# Patient Record
Sex: Female | Born: 1975 | Race: Black or African American | Hispanic: No | Marital: Married | Smoking: Never smoker
Health system: Southern US, Community
[De-identification: ages and names within clinical notes are randomized; demographics above are authoritative.]

## PROBLEM LIST (undated history)

## (undated) DIAGNOSIS — Z789 Other specified health status: Secondary | ICD-10-CM

## (undated) DIAGNOSIS — I10 Essential (primary) hypertension: Secondary | ICD-10-CM

## (undated) DIAGNOSIS — D649 Anemia, unspecified: Secondary | ICD-10-CM

## (undated) DIAGNOSIS — Z0282 Encounter for adoption services: Secondary | ICD-10-CM

## (undated) DIAGNOSIS — R011 Cardiac murmur, unspecified: Secondary | ICD-10-CM

## (undated) HISTORY — DX: Other specified health status: Z78.9

## (undated) HISTORY — DX: Encounter for adoption services: Z02.82

---

## 1999-06-20 ENCOUNTER — Inpatient Hospital Stay (HOSPITAL_COMMUNITY): Admission: AD | Admit: 1999-06-20 | Discharge: 1999-06-20 | Payer: Self-pay | Admitting: *Deleted

## 1999-12-20 ENCOUNTER — Emergency Department (HOSPITAL_COMMUNITY): Admission: EM | Admit: 1999-12-20 | Discharge: 1999-12-20 | Payer: Self-pay | Admitting: Emergency Medicine

## 2020-06-01 ENCOUNTER — Emergency Department (HOSPITAL_BASED_OUTPATIENT_CLINIC_OR_DEPARTMENT_OTHER)
Admission: EM | Admit: 2020-06-01 | Discharge: 2020-06-01 | Disposition: A | Discharge status: Home / Self Care | Payer: Self-pay | Attending: Emergency Medicine | Admitting: Emergency Medicine

## 2020-06-01 ENCOUNTER — Other Ambulatory Visit: Payer: Self-pay

## 2020-06-01 ENCOUNTER — Encounter (HOSPITAL_BASED_OUTPATIENT_CLINIC_OR_DEPARTMENT_OTHER): Payer: Self-pay | Admitting: Emergency Medicine

## 2020-06-01 DIAGNOSIS — R220 Localized swelling, mass and lump, head: Secondary | ICD-10-CM

## 2020-06-01 DIAGNOSIS — K0889 Other specified disorders of teeth and supporting structures: Secondary | ICD-10-CM | POA: Insufficient documentation

## 2020-06-01 MED ORDER — AMOXICILLIN-POT CLAVULANATE 875-125 MG PO TABS: 1.0000 | ORAL_TABLET | Freq: Two times a day (BID) | ORAL | 0 refills | Status: DC

## 2020-06-01 MED ORDER — NAPROXEN 250 MG PO TABS
500.0000 mg | ORAL_TABLET | Freq: Once | ORAL | Status: AC
  Administered 2020-06-01: 500 mg via ORAL
  Filled 2020-06-01: qty 2

## 2020-06-01 MED ORDER — PENICILLIN V POTASSIUM 250 MG PO TABS
500.0000 mg | ORAL_TABLET | ORAL | Status: AC
  Administered 2020-06-01: 500 mg via ORAL
  Filled 2020-06-01: qty 2

## 2020-06-01 NOTE — ED Triage Notes (Signed)
Pc/o dental pain with left sided facial swelling.

## 2020-06-01 NOTE — Discharge Instructions (Signed)
Take 4 over the counter ibuprofen tablets 3 times a day or 2 over-the-counter naproxen tablets twice a day for pain. Also take tylenol 1000mg (2 extra strength) four times a day.   Return for difficulty swallowing, fever.  Follow up with a dentist in the office.  Follow up with your PCP.

## 2020-06-01 NOTE — ED Provider Notes (Signed)
[facility] EMERGENCY DEPARTMENT Provider Note   CSN: [id number] Arrival date & time: 06/01/20  0457     History Chief Complaint  Patient presents with  . Dental Pain    Tanya Murray is a 44 y.o. female.  44 yo F with a chief complaints of left-sided facial swelling.  This been going on for a couple days.  She said it started with some dental pain and now associated with swelling.  She denies fevers denies difficulty swallowing.  Has been having trouble with posterior molar on the left side.  Denies ear pain denies cough or congestion.  The history is provided by the patient.  Dental Pain Location:  Lower Lower teeth location:  18/LL 2nd molar Quality:  Aching Severity:  Mild Onset quality:  Gradual Duration:  3 days Timing:  Constant Progression:  Worsening Chronicity:  New Relieved by:  Nothing Worsened by:  Nothing Ineffective treatments:  None tried Associated symptoms: facial swelling   Associated symptoms: no congestion, no fever and no headaches        History reviewed. No pertinent past medical history.  There are no problems to display for this patient.   History reviewed. No pertinent surgical history.   OB History   No obstetric history on file.     No family history on file.  Social History   Tobacco Use  . Smoking status: Never Smoker  . Smokeless tobacco: Never Used  Substance Use Topics  . Alcohol use: Yes  . Drug use: Not on file    Home Medications Prior to Admission medications   Medication Sig Start Date End Date Taking? Authorizing Provider  amoxicillin-clavulanate (AUGMENTIN) 875-125 MG tablet Take 1 tablet by mouth every 12 (twelve) hours. 06/01/20   Deno Etienne, DO    Allergies    Patient has no known allergies.  Review of Systems   Review of Systems  Constitutional: Negative for chills and fever.  HENT: Positive for dental problem and facial swelling. Negative for congestion and rhinorrhea.   Eyes: Negative  for redness and visual disturbance.  Respiratory: Negative for shortness of breath and wheezing.   Cardiovascular: Negative for chest pain and palpitations.  Gastrointestinal: Negative for nausea and vomiting.  Genitourinary: Negative for dysuria and urgency.  Musculoskeletal: Negative for arthralgias and myalgias.  Skin: Negative for pallor and wound.  Neurological: Negative for dizziness and headaches.    Physical Exam Updated Vital Signs BP 132/80 (BP Location: Right Arm)   Pulse 86   Temp 98.8 F (37.1 C) (Oral)   Resp 18   Ht 5\' 10"  (1.778 m)   Wt 93 kg   SpO2 100%   BMI 29.41 kg/m   Physical Exam Vitals and nursing note reviewed.  Constitutional:      General: She is not in acute distress.    Appearance: She is well-developed. She is not diaphoretic.  HENT:     Head: Normocephalic and atraumatic.      Mouth/Throat:      Comments: No trismus Eyes:     Pupils: Pupils are equal, round, and reactive to light.  Cardiovascular:     Rate and Rhythm: Normal rate and regular rhythm.     Heart sounds: No murmur heard.  No friction rub. No gallop.   Pulmonary:     Effort: Pulmonary effort is normal.     Breath sounds: No wheezing or rales.  Abdominal:     General: There is no distension.  Palpations: Abdomen is soft.     Tenderness: There is no abdominal tenderness.  Musculoskeletal:        General: No tenderness.     Cervical back: Normal range of motion and neck supple.  Skin:    General: Skin is warm and dry.  Neurological:     Mental Status: She is alert and oriented to person, place, and time.  Psychiatric:        Behavior: Behavior normal.     ED Results / Procedures / Treatments   Labs (all labs ordered are listed, but only abnormal results are displayed) Labs Reviewed - No data to display  EKG None  Radiology No results found.  Procedures Procedures (including critical care time)  Medications Ordered in ED Medications  penicillin v  potassium (VEETID) tablet 500 mg (500 mg Oral Given 06/01/20 0722)  naproxen (NAPROSYN) tablet 500 mg (500 mg Oral Given 06/01/20 3646)    ED Course  I have reviewed the triage vital signs and the nursing notes.  Pertinent labs & imaging results that were available during my care of the patient were reviewed by me and considered in my medical decision making (see chart for details).    MDM Rules/Calculators/A&P                          44 yo F with a chief complaint of facial swelling.  She thinks is coming from her teeth.  She has no obvious dental issue currently.  She has what appears to be a chronic erosion to the left second molar.  She has no active tenderness there nor any swelling intraorally.  Her swelling seems to be submandibular.  Possible sialolithiasis.  We will have her treat with oral candies.  We will have her follow-up with her dentist.  Course of antibiotics for possible infection.  7:57 AM:  I have discussed the diagnosis/risks/treatment options with the patient and believe the pt to be eligible for discharge home to follow-up with Dentist. We also discussed returning to the ED immediately if new or worsening sx occur. We discussed the sx which are most concerning (e.g., sudden worsening pain, fever, inability to tolerate by mouth) that necessitate immediate return. Medications administered to the patient during their visit and any new prescriptions provided to the patient are listed below.  Medications given during this visit Medications  penicillin v potassium (VEETID) tablet 500 mg (500 mg Oral Given 06/01/20 0722)  naproxen (NAPROSYN) tablet 500 mg (500 mg Oral Given 06/01/20 8032)     The patient appears reasonably screen and/or stabilized for discharge and I doubt any other medical condition or other [facility] requiring further screening, evaluation, or treatment in the ED at this time prior to discharge.   Final Clinical Impression(s) / ED Diagnoses Final diagnoses:   Facial swelling    Rx / DC Orders ED Discharge Orders         Ordered    amoxicillin-clavulanate (AUGMENTIN) 875-125 MG tablet  Every 12 hours        06/01/20 [place], [place], DO 06/01/20 [phone]

## 2020-12-09 ENCOUNTER — Other Ambulatory Visit: Payer: Self-pay

## 2020-12-09 ENCOUNTER — Encounter (HOSPITAL_COMMUNITY): Payer: Self-pay

## 2020-12-09 ENCOUNTER — Inpatient Hospital Stay (HOSPITAL_COMMUNITY): Payer: BC Managed Care – PPO

## 2020-12-09 ENCOUNTER — Observation Stay (HOSPITAL_COMMUNITY)
Admission: EM | Admit: 2020-12-09 | Discharge: 2020-12-10 | Disposition: A | Discharge status: Home / Self Care | Payer: BC Managed Care – PPO | Attending: Internal Medicine | Admitting: Internal Medicine

## 2020-12-09 ENCOUNTER — Other Ambulatory Visit (HOSPITAL_COMMUNITY): Payer: BC Managed Care – PPO

## 2020-12-09 ENCOUNTER — Emergency Department (HOSPITAL_COMMUNITY): Payer: BC Managed Care – PPO

## 2020-12-09 DIAGNOSIS — D509 Iron deficiency anemia, unspecified: Secondary | ICD-10-CM | POA: Diagnosis not present

## 2020-12-09 DIAGNOSIS — E611 Iron deficiency: Secondary | ICD-10-CM | POA: Diagnosis not present

## 2020-12-09 DIAGNOSIS — Z20822 Contact with and (suspected) exposure to covid-19: Secondary | ICD-10-CM | POA: Insufficient documentation

## 2020-12-09 DIAGNOSIS — R161 Splenomegaly, not elsewhere classified: Secondary | ICD-10-CM | POA: Insufficient documentation

## 2020-12-09 DIAGNOSIS — R7989 Other specified abnormal findings of blood chemistry: Secondary | ICD-10-CM | POA: Diagnosis present

## 2020-12-09 DIAGNOSIS — D61818 Other pancytopenia: Secondary | ICD-10-CM | POA: Diagnosis not present

## 2020-12-09 DIAGNOSIS — D649 Anemia, unspecified: Secondary | ICD-10-CM | POA: Diagnosis not present

## 2020-12-09 DIAGNOSIS — R160 Hepatomegaly, not elsewhere classified: Secondary | ICD-10-CM | POA: Insufficient documentation

## 2020-12-09 DIAGNOSIS — R17 Unspecified jaundice: Secondary | ICD-10-CM

## 2020-12-09 LAB — RETICULOCYTES
Immature Retic Fract: 20.9 % — ABNORMAL HIGH (ref 2.3–15.9)
RBC.: 3.69 MIL/uL — ABNORMAL LOW (ref 3.87–5.11)
Retic Count, Absolute: 58.7 10*3/uL (ref 19.0–186.0)
Retic Ct Pct: 1.6 % (ref 0.4–3.1)

## 2020-12-09 LAB — I-STAT BETA HCG BLOOD, ED (MC, WL, AP ONLY): I-stat hCG, quantitative: <5 m[IU]/mL (ref <5)

## 2020-12-09 LAB — CBC WITH DIFFERENTIAL/PLATELET
Abs Immature Granulocytes: 0 10*3/uL (ref 0.00–0.07)
Basophils Absolute: 0 10*3/uL (ref 0.0–0.1)
Basophils Relative: 1 %
Eosinophils Absolute: 0.1 10*3/uL (ref 0.0–0.5)
Eosinophils Relative: 2 %
HCT: 21.1 % — ABNORMAL LOW (ref 36.0–46.0)
Hemoglobin: 4.9 g/dL — CL (ref 12.0–15.0)
Immature Granulocytes: 0 %
Lymphocytes Relative: 45 %
Lymphs Abs: 1.3 10*3/uL (ref 0.7–4.0)
MCH: 13.5 pg — ABNORMAL LOW (ref 26.0–34.0)
MCHC: 23.2 g/dL — ABNORMAL LOW (ref 30.0–36.0)
MCV: 58 fL — ABNORMAL LOW (ref 80.0–100.0)
Monocytes Absolute: 0.3 10*3/uL (ref 0.1–1.0)
Monocytes Relative: 10 %
Neutro Abs: 1.3 10*3/uL — ABNORMAL LOW (ref 1.7–7.7)
Neutrophils Relative %: 42 %
Platelets: 145 10*3/uL — ABNORMAL LOW (ref 150–400)
RBC: 3.64 MIL/uL — ABNORMAL LOW (ref 3.87–5.11)
RDW: 29.2 % — ABNORMAL HIGH (ref 11.5–15.5)
WBC: 3 10*3/uL — ABNORMAL LOW (ref 4.0–10.5)
nRBC: 1 % — ABNORMAL HIGH (ref 0.0–0.2)

## 2020-12-09 LAB — ABO/RH: ABO/RH(D): A POS

## 2020-12-09 LAB — COMPREHENSIVE METABOLIC PANEL
ALT: 17 U/L (ref 0–44)
AST: 20 U/L (ref 15–41)
Albumin: 4.6 g/dL (ref 3.5–5.0)
Alkaline Phosphatase: 47 U/L (ref 38–126)
Anion gap: 9 (ref 5–15)
BUN: 13 mg/dL (ref 6–20)
CO2: 22 mmol/L (ref 22–32)
Calcium: 9.8 mg/dL (ref 8.9–10.3)
Chloride: 107 mmol/L (ref 98–111)
Creatinine, Ser: 0.65 mg/dL (ref 0.44–1.00)
GFR, Estimated: >60 mL/min (ref >60)
Glucose, Bld: 94 mg/dL (ref 70–99)
Potassium: 3.8 mmol/L (ref 3.5–5.1)
Sodium: 138 mmol/L (ref 135–145)
Total Bilirubin: 1.8 mg/dL — ABNORMAL HIGH (ref 0.3–1.2)
Total Protein: 8.9 g/dL — ABNORMAL HIGH (ref 6.5–8.1)

## 2020-12-09 LAB — IRON AND TIBC
Iron: 20 ug/dL — ABNORMAL LOW (ref 28–170)
Saturation Ratios: 3 % — ABNORMAL LOW (ref 10.4–31.8)
TIBC: 603 ug/dL — ABNORMAL HIGH (ref 250–450)
UIBC: 583 ug/dL

## 2020-12-09 LAB — VITAMIN B12: Vitamin B-12: 328 pg/mL (ref 180–914)

## 2020-12-09 LAB — FOLATE: Folate: 7 ng/mL (ref >5.9)

## 2020-12-09 LAB — LACTATE DEHYDROGENASE: LDH: 140 U/L (ref 98–192)

## 2020-12-09 LAB — POC OCCULT BLOOD, ED: Fecal Occult Bld: NEGATIVE

## 2020-12-09 LAB — PROTIME-INR
INR: 1.1 (ref 0.8–1.2)
Prothrombin Time: 14.1 seconds (ref 11.4–15.2)

## 2020-12-09 LAB — FERRITIN: Ferritin: 2 ng/mL — ABNORMAL LOW (ref 11–307)

## 2020-12-09 LAB — PREPARE RBC (CROSSMATCH)

## 2020-12-09 IMAGING — CT CT ABD-PELV W/ CM
2 of 5 series · 16 of 46 positions shown, 18 images · IV contrast (omnipaque)
Comparison: None.

CLINICAL DATA: Abnormal hemoglobin.

EXAM:
CT ABDOMEN AND PELVIS WITH CONTRAST
TECHNIQUE: Multidetector CT imaging of the abdomen and pelvis was performed
using the standard protocol following bolus administration of
intravenous contrast.
CONTRAST:  100mL OMNIPAQUE IOHEXOL 300 MG/ML  SOLN

[Series 2: axial st · axial · 0.87mm/px · z∈[+1298,+1718]mm · 13 of 98 slices shown, 15 images]
[im 7/98  soft-tissue]
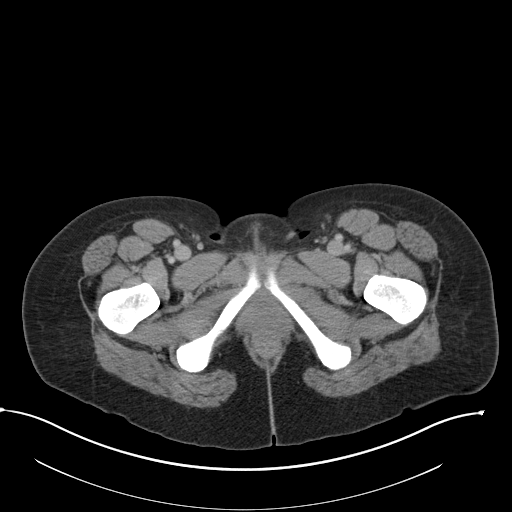
[im 7/98  bone]
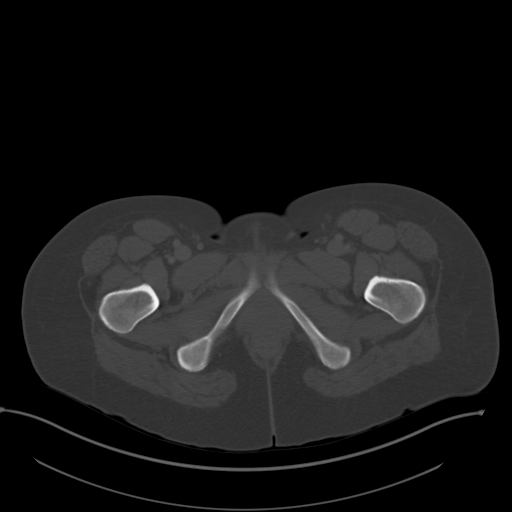
[im 14/98  soft-tissue]
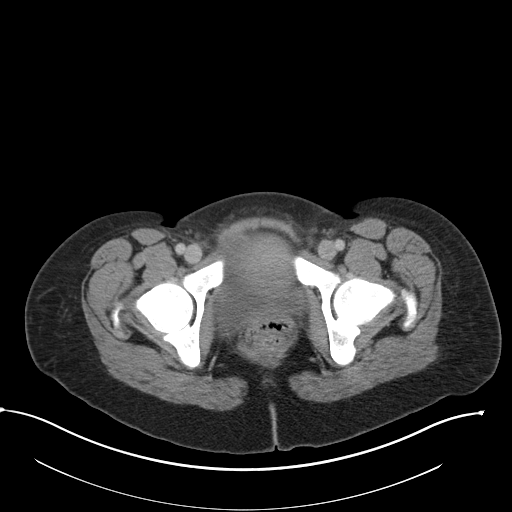
[im 21/98  soft-tissue]
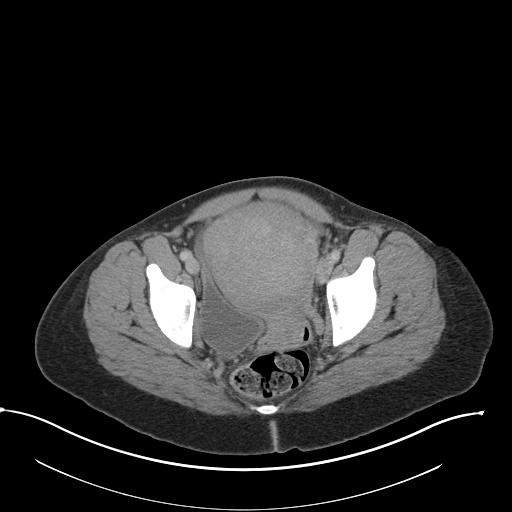
[im 28/98  soft-tissue]
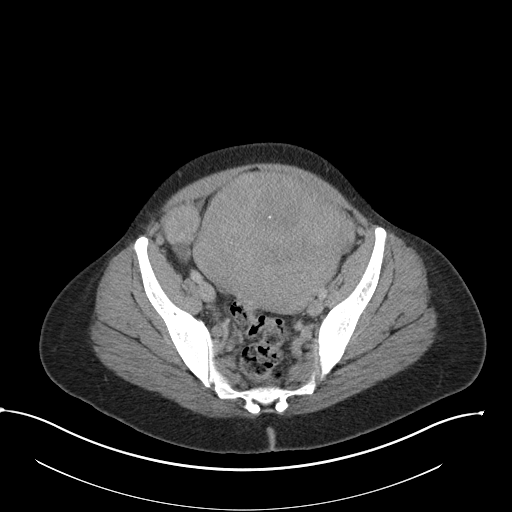
[im 35/98  soft-tissue]
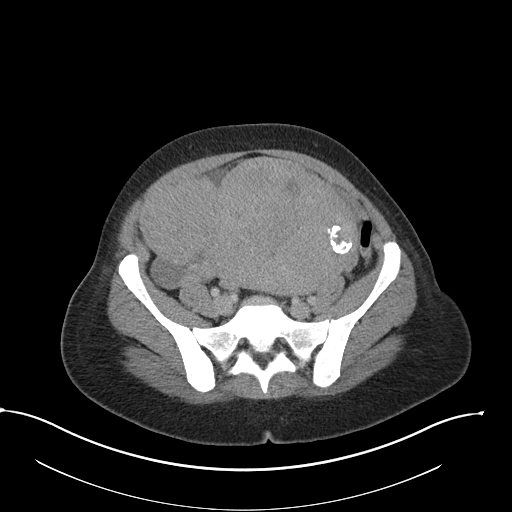
[im 42/98  soft-tissue]
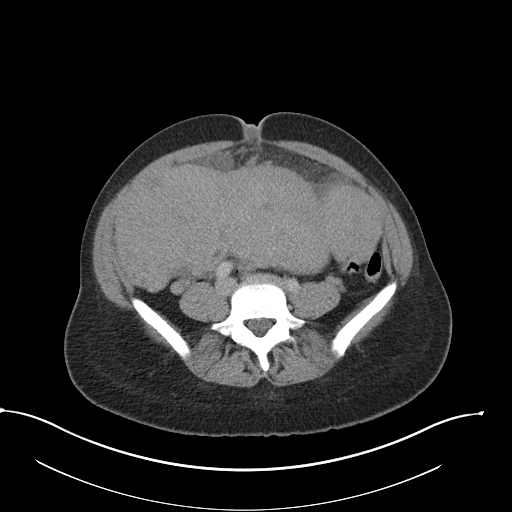
[im 49/98  soft-tissue]
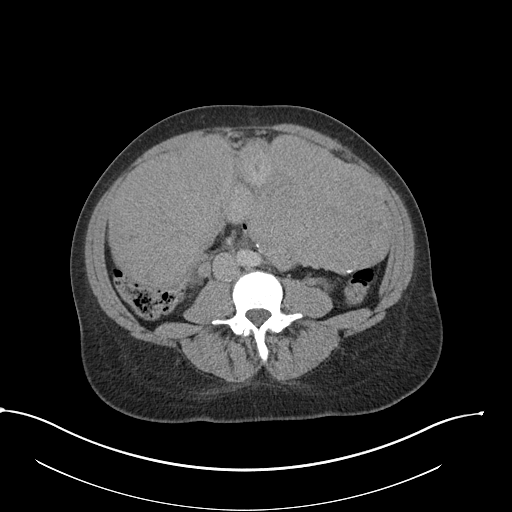
[im 56/98  soft-tissue]
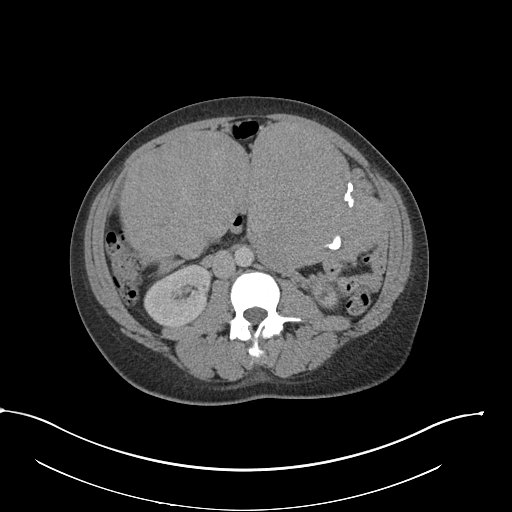
[im 63/98  soft-tissue]
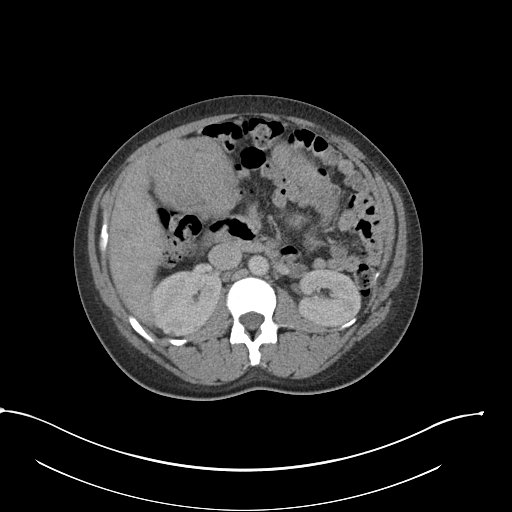
[im 63/98  bone]
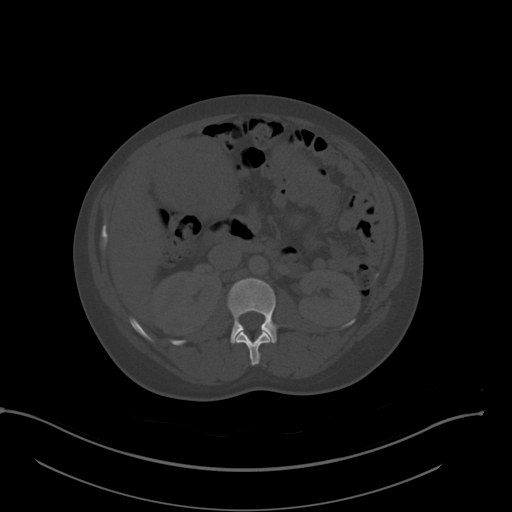
[im 70/98  soft-tissue]
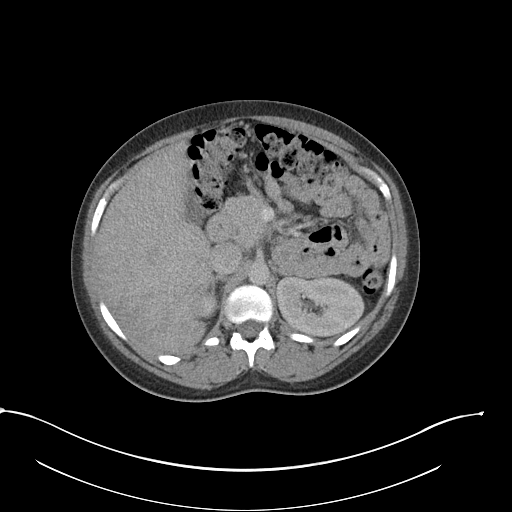
[im 77/98  soft-tissue]
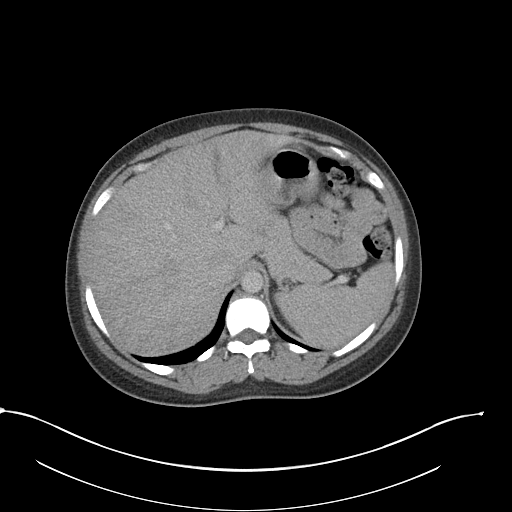
[im 84/98  soft-tissue]
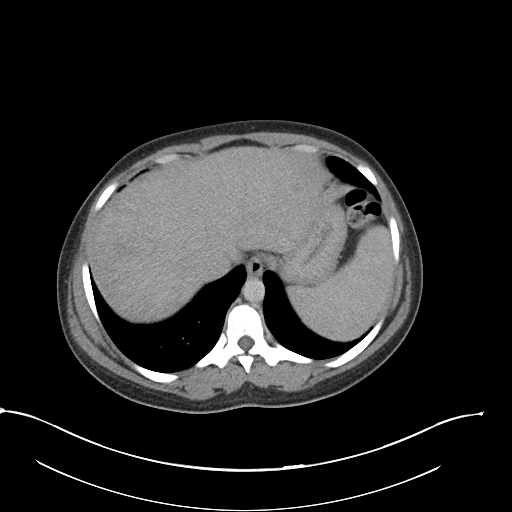
[im 91/98  soft-tissue]
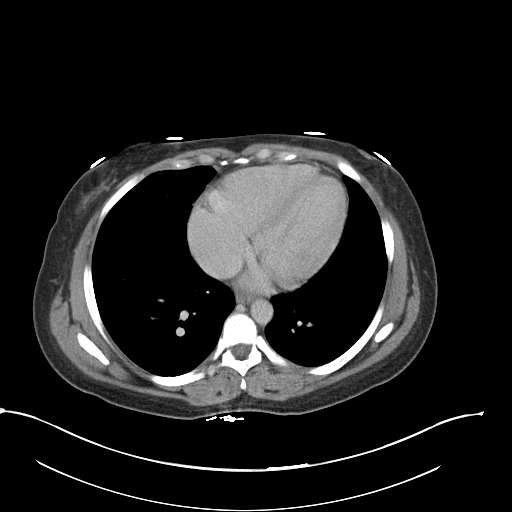

[Series 5: coronal st · coronal · 0.80mm/px · 3 of 175 slices shown]
[im 59/175  soft-tissue]
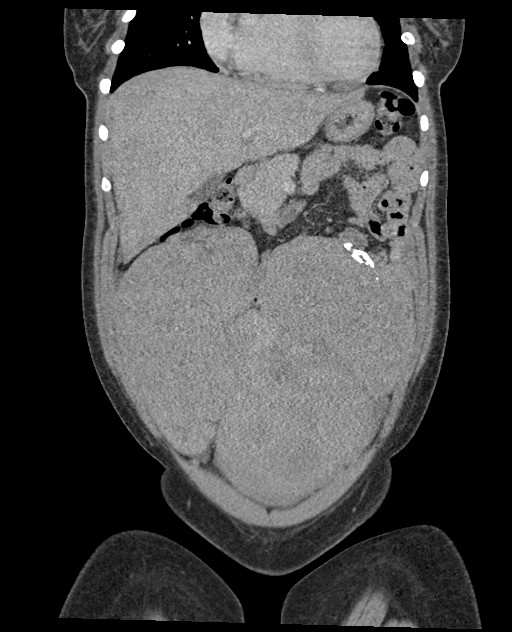
[im 78/175  soft-tissue]
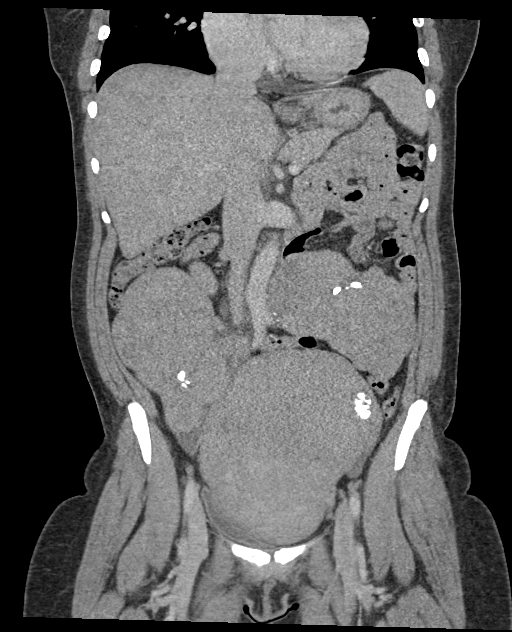
[im 97/175  soft-tissue]
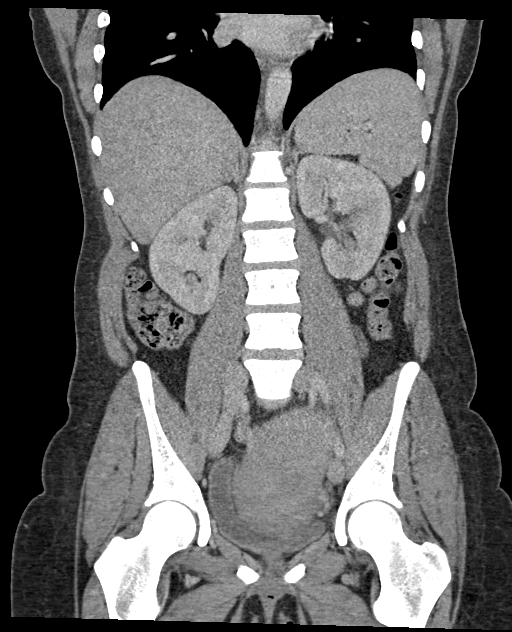

[16 of 46 positions shown; findings below may reference images not displayed]

FINDINGS: Lower chest: No acute abnormality.

Hepatobiliary: The liver parenchyma is heterogeneous in appearance.
No focal liver abnormality is seen. No gallstones, gallbladder wall
thickening, or biliary dilatation.

Pancreas: Unremarkable. No pancreatic ductal dilatation or
surrounding inflammatory changes.

Spleen: Normal in size without focal abnormality.

Adrenals/Urinary Tract: Adrenal glands are unremarkable. Kidneys are
normal in size, without renal calculi or hydronephrosis. A 6 mm
cystic appearing area is seen within the posterior aspect of the mid
right kidney. Bladder is unremarkable.

Stomach/Bowel: Stomach is within normal limits. Appendix appears
normal. No evidence of bowel wall thickening, distention, or
inflammatory changes.

Vascular/Lymphatic: No significant vascular findings are present. No
enlarged abdominal or pelvic lymph nodes.

Reproductive: The uterus is markedly enlarged and contains multiple
large, heterogeneous, partially calcified soft tissue masses. The
largest masses measure approximately 14.8 cm by 9.6 cm x 16.2 cm,
12.7 cm x 15.6 cm x 13.7 cm and 13.3 cm x 12.5 cm x 9.5 cm. These
extend superiorly into the mid to upper abdomen. Mass effect is seen
on the urinary bladder.

A 2.2 cm x 1.5 cm right adnexal cyst is noted.

Other: No abdominal wall hernia or abnormality. No abdominopelvic
ascites.

Musculoskeletal: No acute or significant osseous findings.
IMPRESSION: 1. Multiple extremely large, partially calcified uterine fibroids.
MRI correlation is recommended.
2. Right adnexal cyst, likely ovarian in origin.

## 2020-12-09 IMAGING — US US ABDOMEN COMPLETE
2 series · 13 of 25 positions shown · non-contrast
Comparison: None.

CLINICAL DATA: Suspected hepatosplenomegaly.

EXAM:
ABDOMEN ULTRASOUND COMPLETE

[Series 1: us abdomen complete · 7 of 76 slices shown (1 of 2)]
[im 1/76]
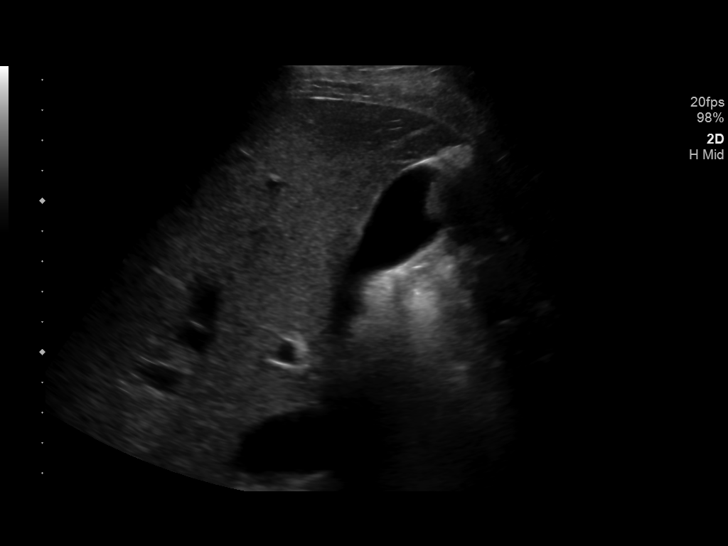
[im 13/76]
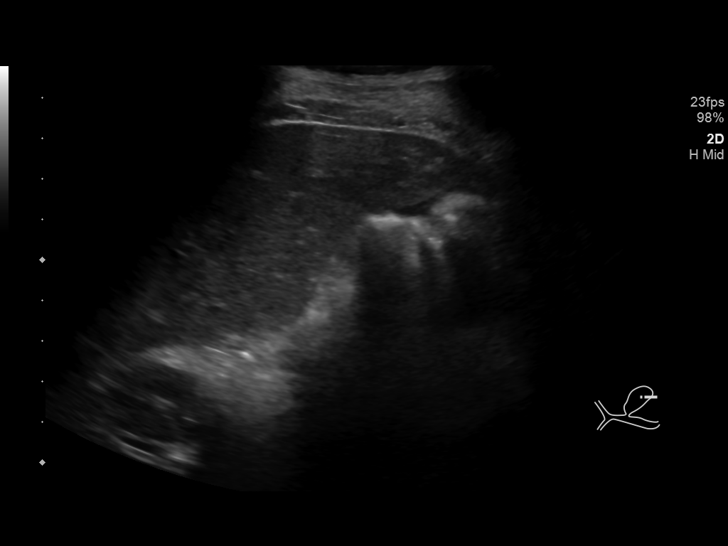
[im 26/76]
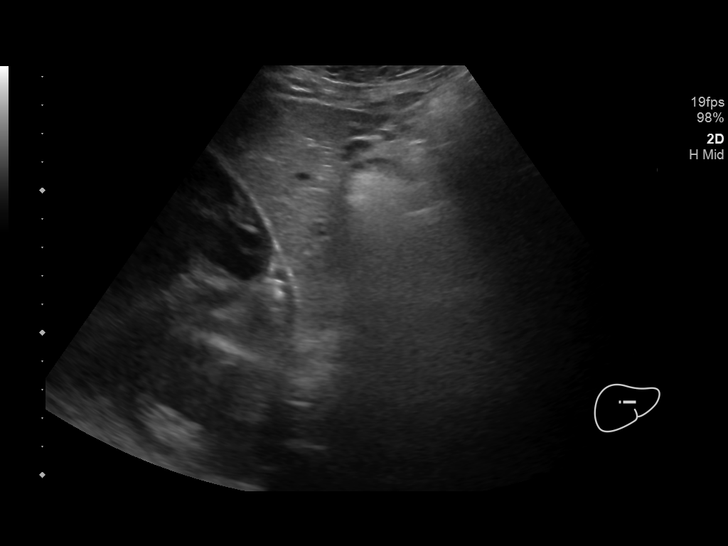
[im 38/76]
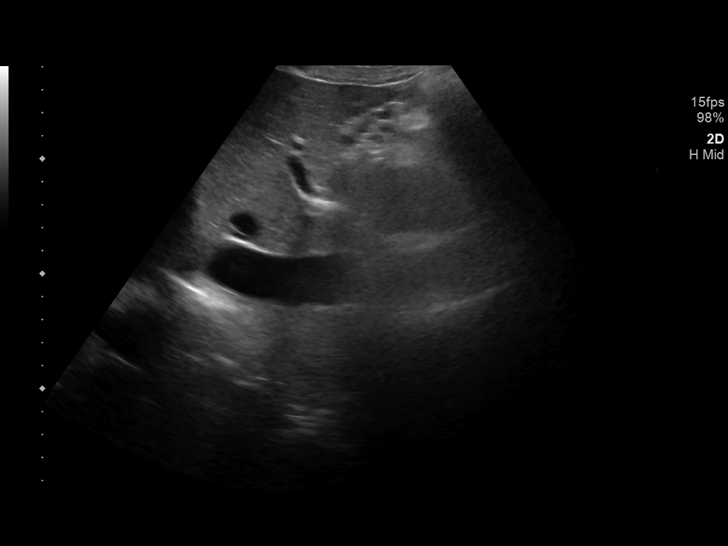
[im 51/76]
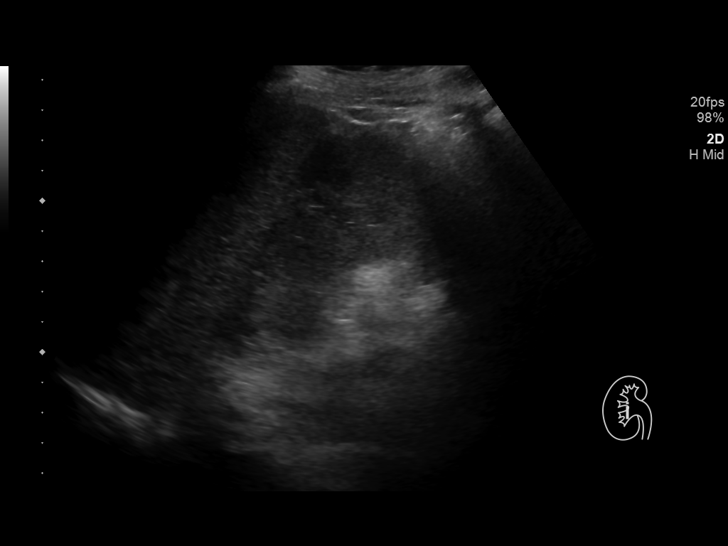
[im 63/76]
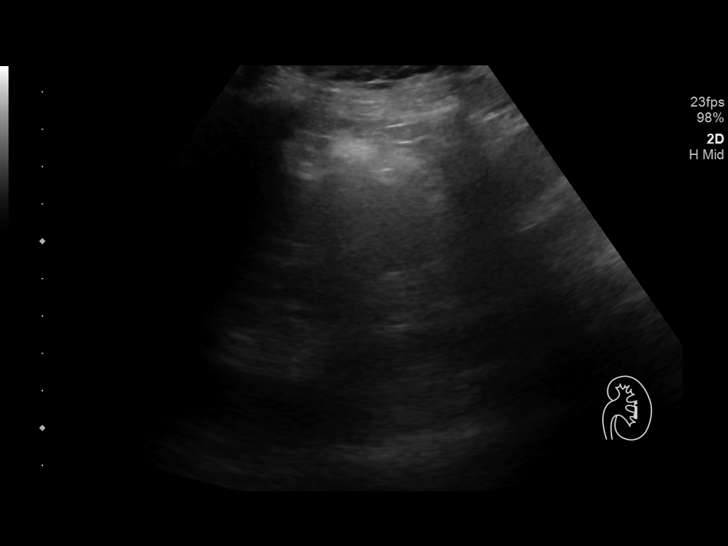
[im 76/76]
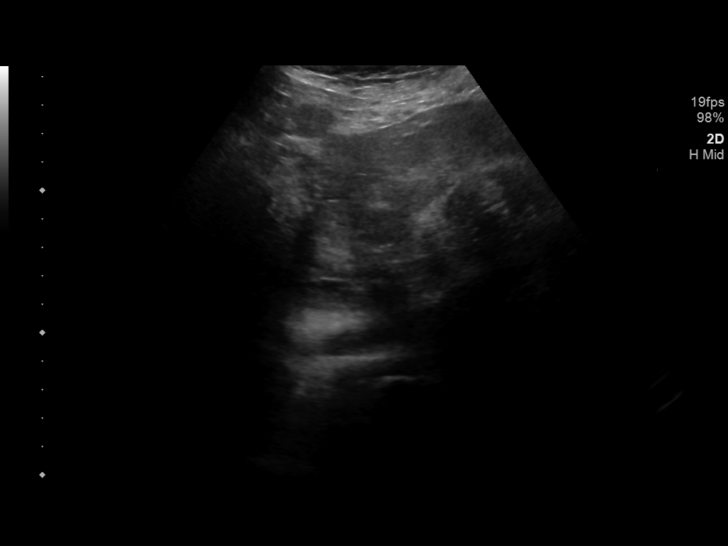

[Series 1: us abdomen complete · 6 of 67 slices shown (2 of 2)]
[im 7/67]
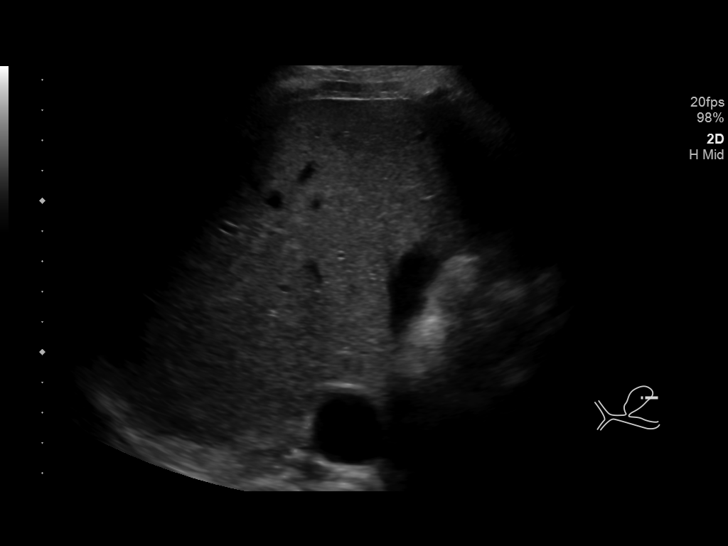
[im 19/67]
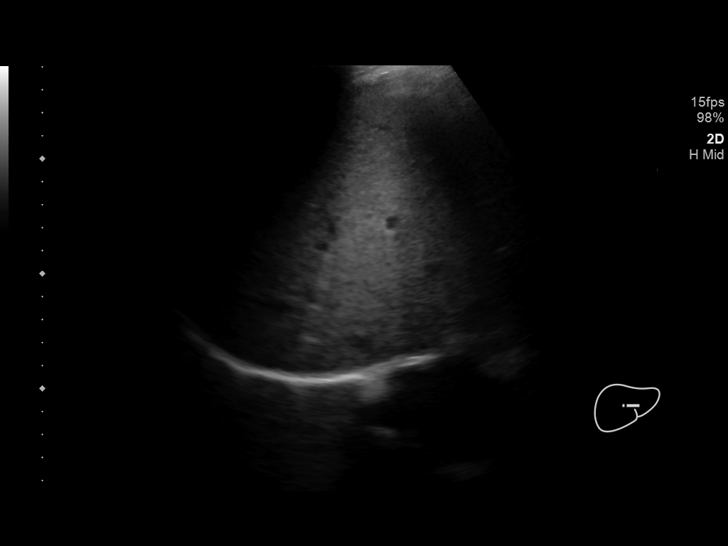
[im 31/67]
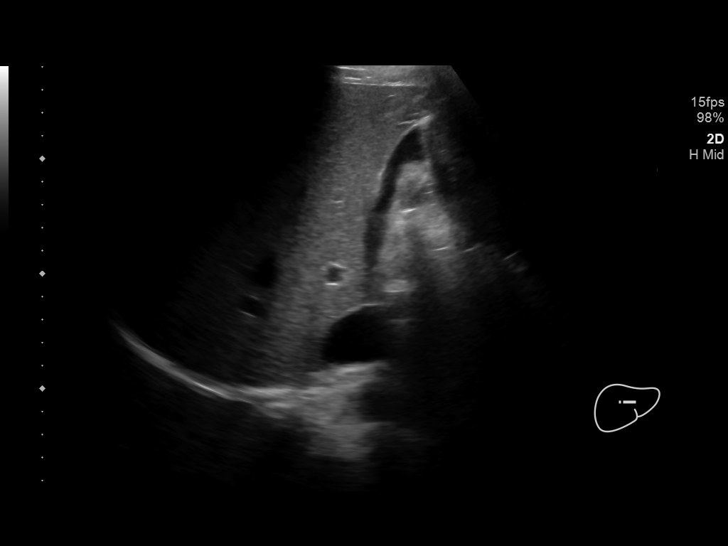
[im 43/67]
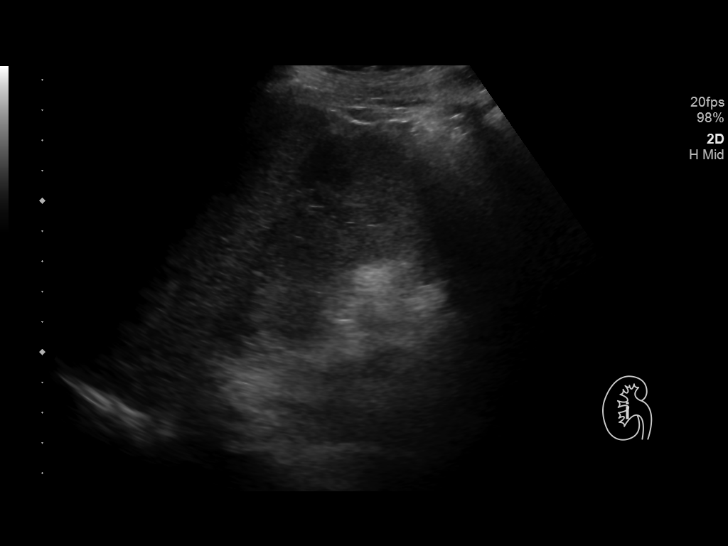
[im 55/67]
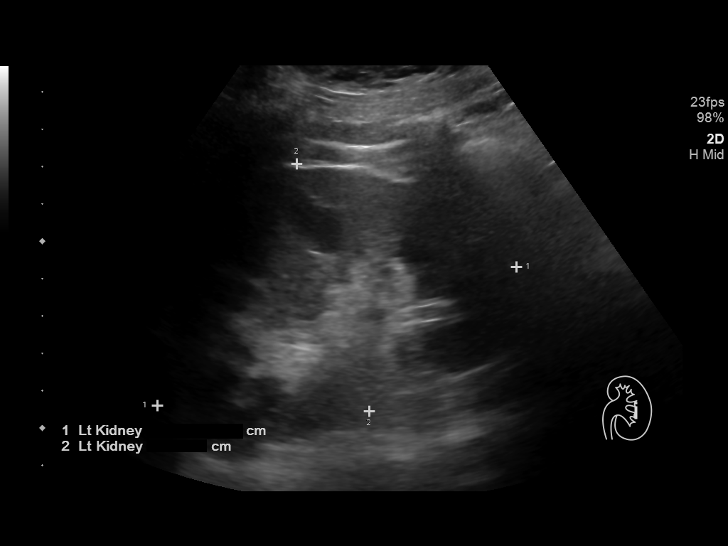
[im 67/67]
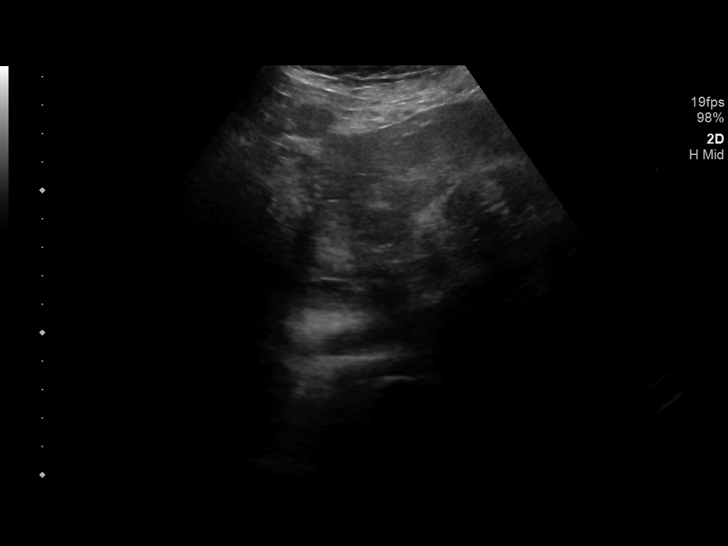

[13 of 25 positions shown; findings below may reference images not displayed]

FINDINGS: Gallbladder: No gallstones or wall thickening visualized (2.1 mm).
No sonographic Murphy sign noted by sonographer.

Common bile duct: Diameter: 4.2 mm

Liver: No focal lesion identified. Within normal limits in
parenchymal echogenicity. Portal vein is patent on color Doppler
imaging with normal direction of blood flow towards the liver.

IVC: No abnormality visualized.

Pancreas: Poorly visualized secondary to overlying bowel gas.

Spleen: Size (4.4 cm) and appearance within normal limits (limited
in evaluation secondary to overlying bowel gas).

Right Kidney: Length: 11.5 cm. Echogenicity within normal limits. No
mass or hydronephrosis visualized.

Left Kidney: Length: 10.3 cm. Echogenicity within normal limits. No
mass or hydronephrosis visualized.

Abdominal aorta: No aneurysm visualized.

Other findings: Limited study secondary to the patient's body
habitus and overlying bowel gas.

Of incidental note is the presence of an enlarged uterus containing
uterine fibroids.
IMPRESSION: 1. No evidence of hepatosplenomegaly.
2. Enlarged fibroid uterus.

## 2020-12-09 MED ORDER — ACETAMINOPHEN 650 MG RE SUPP: 650.0000 mg | Freq: Four times a day (QID) | RECTAL | Status: DC | PRN

## 2020-12-09 MED ORDER — IOHEXOL 300 MG/ML  SOLN
100.0000 mL | Freq: Once | INTRAMUSCULAR | Status: AC | PRN
  Administered 2020-12-09: 100 mL via INTRAVENOUS

## 2020-12-09 MED ORDER — SODIUM CHLORIDE 0.9 % IV SOLN
10.0000 mL/h | Freq: Once | INTRAVENOUS | Status: AC
  Administered 2020-12-09: 10 mL/h via INTRAVENOUS

## 2020-12-09 MED ORDER — SODIUM CHLORIDE 0.9 % IV SOLN
1000.0000 mg | Freq: Once | INTRAVENOUS | Status: AC
  Administered 2020-12-09: 1000 mg via INTRAVENOUS
  Filled 2020-12-09: qty 20

## 2020-12-09 MED ORDER — ONDANSETRON HCL 4 MG PO TABS: 4.0000 mg | ORAL_TABLET | Freq: Four times a day (QID) | ORAL | Status: DC | PRN

## 2020-12-09 MED ORDER — ACETAMINOPHEN 325 MG PO TABS
650.0000 mg | ORAL_TABLET | Freq: Four times a day (QID) | ORAL | Status: DC | PRN
Start: 2020-12-09 — End: 2020-12-10

## 2020-12-09 MED ORDER — SODIUM CHLORIDE 0.9 % IV SOLN: 500.0000 mg | Freq: Once | INTRAVENOUS | Status: DC

## 2020-12-09 MED ORDER — SODIUM CHLORIDE 0.9 % IV SOLN: 25.0000 mg | Freq: Once | INTRAVENOUS | Status: DC

## 2020-12-09 MED ORDER — ONDANSETRON HCL 4 MG/2ML IJ SOLN: 4.0000 mg | Freq: Four times a day (QID) | INTRAMUSCULAR | Status: DC | PRN

## 2020-12-09 MED ORDER — SODIUM CHLORIDE 0.9 % IV SOLN
25.0000 mg | Freq: Once | INTRAVENOUS | Status: AC
  Administered 2020-12-09: 25 mg via INTRAVENOUS
  Filled 2020-12-09: qty 0.5

## 2020-12-09 NOTE — ED Triage Notes (Signed)
Pt presents with c/o abnormal lab. Pt reports she went to see a doctor for the first time in a while and had routine labs done and was told she had a hemoglobin of 4.3.

## 2020-12-09 NOTE — H&P (Signed)
History and Physical        Hospital Admission Note Date: 12/09/2020  Patient name: Tanya Murray Medical record number: [id number] Date of birth: 01-13-1976 Age: 45 y.o. Gender: female  PCP: [name], DO    Chief Complaint    Chief Complaint  Patient presents with  . Abnormal Lab      HPI:   This is a 45 year old female with no significant past medical history who presents to the ED with abnormal lab work with Hb 4.3. She established care with a PCP yesterday and was found to have anemia on routine lab work obtained today and referred to the ED.  Patient's wife over the phone reports that she has noticed a 10 pound unintentional weight loss over the past 3 to 6 months.  Wife also admits the patient's abdomen has appeared more distended.  She has also noticed that the patient has had shortness of breath on exertion as well as poor p.o. intake for the past several months with intermittent nonbloody vomiting between meals.  She states the patient has been sticking to protein shakes as opposed to solid meals and feels that she gets fuller quicker.  Patient denies any odynophagia and admits to regular nonbloody BMs.  Patient also admits to alcohol use, drinks 1 scotch daily and no history of withdrawal symptoms.  She denies night sweats and no known personal or family history of cancer but does admit to a distant history of uterine fibroids but has not had any intervention or follow-up regarding this and does not have a gynecologist.  Patient reports heavy bleeding during her menstrual cycle with blood clots.  Menstrual cycle occurs regularly and was most recently 2 weeks ago and lasts 5 days typically.     ED Course: Afebrile, hemodynamically stable, on room air. Notable Labs: T bili 1.8 otherwise CMP unremarkable. WBC 3.0, Hb 4.9, MCV 58, Platelets 145, Iron 20, Saturation 3, Ferritin  2, vitamin B12 328, FOBT negative, COVID 19 pending. 4 units PRBCs ordered.    Vitals:   12/09/20 1500 12/09/20 1530  BP: 138/74 (!) 143/75  Pulse: 77 80  Resp: 18 15  Temp:    SpO2: 100% 100%     Review of Systems:  Review of Systems  All other systems reviewed and are negative.   Medical/Social/Family History   Past Medical History: History reviewed. No pertinent past medical history.  History reviewed. No pertinent surgical history.  Medications: Prior to Admission medications   Medication Sig Start Date End Date Taking? Authorizing Provider  ibuprofen (ADVIL) 200 MG tablet Take 800 mg by mouth every 6 (six) hours as needed for headache or mild pain.   Yes [provider]  amoxicillin-clavulanate (AUGMENTIN) 875-125 MG tablet Take 1 tablet by mouth every 12 (twelve) hours. Patient not taking: Reported on 12/09/2020 06/01/20   Deno Etienne, DO    Allergies:  No Known Allergies  Social History:  reports that she has never smoked. She has never used smokeless tobacco. She reports current alcohol use. No history on file for drug use.  Family History: History reviewed. No pertinent family history.   Objective   Physical Exam: Blood pressure (!) 143/75, pulse 80,  temperature 99.1 F (37.3 C), temperature source Oral, resp. rate 15, height 5\' 10"  (1.778 m), weight 82.1 kg, last menstrual period 11/25/2020, SpO2 100 %.  Physical Exam Vitals and nursing note reviewed.  Constitutional:      Appearance: Normal appearance.  HENT:     Head: Normocephalic and atraumatic.  Eyes:     Conjunctiva/sclera: Conjunctivae normal.  Cardiovascular:     Rate and Rhythm: Normal rate and regular rhythm.  Pulmonary:     Effort: Pulmonary effort is normal.     Breath sounds: Normal breath sounds.  Abdominal:     General: There is distension.     Comments: Multiple large nontender abdominal masses palpated bilaterally  Musculoskeletal:        General: No swelling or  tenderness.  Skin:    Coloration: Skin is not jaundiced or pale.  Neurological:     Mental Status: She is alert. Mental status is at baseline.  Psychiatric:        Mood and Affect: Mood normal.        Behavior: Behavior normal.     LABS on Admission: I have personally reviewed all the labs and imaging below    Basic Metabolic Panel: Recent Labs  Lab 12/09/20 1326  NA 138  K 3.8  CL 107  CO2 22  GLUCOSE 94  BUN 13  CREATININE 0.65  CALCIUM 9.8   Liver Function Tests: Recent Labs  Lab 12/09/20 1326  AST 20  ALT 17  ALKPHOS 47  BILITOT 1.8*  PROT 8.9*  ALBUMIN 4.6   No results for input(s): LIPASE, AMYLASE in the last 168 hours. No results for input(s): AMMONIA in the last 168 hours. CBC: Recent Labs  Lab 12/09/20 1326  WBC 3.0*  NEUTROABS PENDING  HGB 4.9*  HCT 21.1*  MCV 58.0*  PLT 145*   Cardiac Enzymes: No results for input(s): CKTOTAL, CKMB, CKMBINDEX, TROPONINI in the last 168 hours. BNP: Invalid input(s): POCBNP CBG: No results for input(s): GLUCAP in the last 168 hours.  Radiological Exams on Admission:  No results found.    EKG: Not done   A & P   Principal Problem:   Symptomatic anemia Active Problems:   Iron deficiency   Pancytopenia (HCC)   1. Symptomatic Iron deficiency anemia a. Concern for secondary to abnormal uterine bleeding/uterine fibroids vs undiagnosed malignancy given her weight loss, early satiety and abdominal masses b. Iron infusion c. PRBC transfusion d. CT abdomen/pelvis with IV/Oral contrast  2. Pancytopenia a. Iron/Vitamin deficiency vs malignancy as above  b. WBC 3.0, Hb 4.9, platelets 145 c. Vitamin B12 level borderline low -> MMA level d. Follow-up differential e. Consider heme/onc consult pending above workup     DVT prophylaxis: SCDs   Code Status: Full Code  Diet: heart healthy Family Communication: Admission, patients condition and plan of care including tests being ordered have been  discussed with the patient who indicates understanding and agrees with the plan and Code Status. Patient's wife was updated  Disposition Plan: The appropriate patient status for this patient is INPATIENT. Inpatient status is judged to be reasonable and necessary in order to provide the required intensity of service to ensure the patient's safety. The patient's presenting symptoms, physical exam findings, and initial radiographic and laboratory data in the context of their chronic comorbidities is felt to place them at high risk for further clinical deterioration. Furthermore, it is not anticipated that the patient will be medically stable for discharge from the hospital within  2 midnights of admission. The following factors support the patient status of inpatient.   " The patient's presenting symptoms include abnormal lab work, DOE, early satiety, weight loss. " The worrisome physical exam findings include abdominal masses. " The initial radiographic and laboratory data are worrisome because of anemia. " The chronic co-morbidities include: unknown.   * I certify that at the point of admission it is my clinical judgment that the patient will require inpatient hospital care spanning beyond 2 midnights from the point of admission due to high intensity of service, high risk for further deterioration and high frequency of surveillance required.*  Consultants  . none  Procedures  . none  Time Spent on Admission: 62 minutes    Harold Hedge, DO [facility]  12/09/2020, 3:51 PM

## 2020-12-09 NOTE — ED Notes (Signed)
Sample bag of iron given to pt. Pt had no reactions.

## 2020-12-09 NOTE — ED Provider Notes (Signed)
[facility] DEPT Provider Note   CSN: [id number] Arrival date & time: 12/09/20  1230     History Chief Complaint  Patient presents with  . Abnormal Lab    Tanya Murray is a 45 y.o. female.  HPI    45 year old female comes in with chief complaint of abnormal lab.  Patient has no significant medical history.  She went for a routine visit to establish a new PCP and they noted that her hemoglobin was 4.  Patient reports that she has some fatigue, but that is not necessarily new.  She has known history of menorrhagia, but has never required blood transfusion.  She denies any bloody stools.  There is no family history of cancer and patient denies any weight loss, night sweats.  History reviewed. No pertinent past medical history.  There are no problems to display for this patient.   History reviewed. No pertinent surgical history.   OB History   No obstetric history on file.     History reviewed. No pertinent family history.  Social History   Tobacco Use  . Smoking status: Never Smoker  . Smokeless tobacco: Never Used  Substance Use Topics  . Alcohol use: Yes    Home Medications Prior to Admission medications   Medication Sig Start Date End Date Taking? Authorizing Provider  ibuprofen (ADVIL) 200 MG tablet Take 800 mg by mouth every 6 (six) hours as needed for headache or mild pain.   Yes [provider]  amoxicillin-clavulanate (AUGMENTIN) 875-125 MG tablet Take 1 tablet by mouth every 12 (twelve) hours. Patient not taking: Reported on 12/09/2020 06/01/20   Deno Etienne, DO    Allergies    Patient has no known allergies.  Review of Systems   Review of Systems  Constitutional: Positive for fatigue. Negative for activity change, chills, diaphoresis and fever.  Respiratory: Negative for shortness of breath.   Cardiovascular: Negative for chest pain.  Gastrointestinal: Negative for abdominal distention and abdominal pain.   Neurological: Negative for dizziness.  All other systems reviewed and are negative.   Physical Exam Updated Vital Signs BP 138/74   Pulse 77   Temp 99.1 F (37.3 C) (Oral)   Resp 18   LMP 11/25/2020 (Approximate)   SpO2 100%   Physical Exam Vitals and nursing note reviewed.  Constitutional:      Appearance: She is well-developed.  HENT:     Head: Normocephalic and atraumatic.  Cardiovascular:     Rate and Rhythm: Normal rate.  Pulmonary:     Effort: Pulmonary effort is normal.  Abdominal:     General: Bowel sounds are normal.     Palpations: There is mass.     Tenderness: There is no abdominal tenderness.     Comments: Patient appears to have hepato and splenomegaly  Musculoskeletal:     Cervical back: Normal range of motion and neck supple.  Skin:    General: Skin is warm and dry.  Neurological:     Mental Status: She is alert and oriented to person, place, and time.     ED Results / Procedures / Treatments   Labs (all labs ordered are listed, but only abnormal results are displayed) Labs Reviewed  COMPREHENSIVE METABOLIC PANEL - Abnormal; Notable for the following components:      Result Value   Total Protein 8.9 (*)    Total Bilirubin 1.8 (*)    All other components within normal limits  CBC WITH DIFFERENTIAL/PLATELET - Abnormal; Notable  for the following components:   WBC 3.0 (*)    RBC 3.64 (*)    Hemoglobin 4.9 (*)    HCT 21.1 (*)    MCV 58.0 (*)    MCH 13.5 (*)    MCHC 23.2 (*)    RDW 29.2 (*)    Platelets 145 (*)    nRBC 1.0 (*)    All other components within normal limits  IRON AND TIBC - Abnormal; Notable for the following components:   Iron 20 (*)    TIBC 603 (*)    Saturation Ratios 3 (*)    All other components within normal limits  FERRITIN - Abnormal; Notable for the following components:   Ferritin 2 (*)    All other components within normal limits  RETICULOCYTES - Abnormal; Notable for the following components:   RBC. 3.69 (*)     Immature Retic Fract 20.9 (*)    All other components within normal limits  SARS CORONAVIRUS 2 (TAT 6-24 HRS)  PROTIME-INR  VITAMIN B12  FOLATE  OCCULT BLOOD X 1 CARD TO LAB, STOOL  LACTATE DEHYDROGENASE  I-STAT BETA HCG BLOOD, ED ([facility], [facility], [facility] ONLY)  POC OCCULT BLOOD, ED  TYPE AND SCREEN  PREPARE RBC (CROSSMATCH)  ABO/RH    EKG None  Radiology No results found.  Procedures .Critical Care Performed by: Varney Biles, MD Authorized by: Varney Biles, MD   Critical care provider statement:    Critical care time (minutes):  36   Critical care was necessary to treat or prevent imminent or life-threatening deterioration of the following conditions:  Circulatory failure   Critical care was time spent personally by me on the following activities:  Discussions with consultants, evaluation of patient's response to treatment, examination of patient, ordering and performing treatments and interventions, ordering and review of laboratory studies, ordering and review of radiographic studies, pulse oximetry, re-evaluation of patient's condition, obtaining history from patient or surrogate and review of old charts     Medications Ordered in ED Medications  0.9 %  sodium chloride infusion (has no administration in time range)    ED Course  I have reviewed the triage vital signs and the nursing notes.  Pertinent labs & imaging results that were available during my care of the patient were reviewed by me and considered in my medical decision making (see chart for details).    MDM Rules/Calculators/A&P                          45 year old female comes in a chief complaint of abnormal labs.  She was noted to have severe anemia with hemoglobin of around 4.  Patient is not having significant symptoms from it, therefore I suspect that she has chronic anemia.  Patient is noted to have icteric sclera and she has hepatosplenomegaly.  I suspect that she could have lymphoma, splenic  sequestration.  Bone marrow malfunction, hemolysis, TTP/ITP, blood loss anemia also possible.  She is guaiac negative.  We will start labs and get ultrasound abdomen and reassess. She will need admission to the hospital and has consented to 4 units of blood.  Results of the ER discussed with the patient. Final Clinical Impression(s) / ED Diagnoses Final diagnoses:  Anemia, unspecified type  Elevated bilirubin    Rx / DC Orders ED Discharge Orders    None       Varney Biles, MD 12/09/20 1516

## 2020-12-10 DIAGNOSIS — E611 Iron deficiency: Secondary | ICD-10-CM

## 2020-12-10 DIAGNOSIS — D61818 Other pancytopenia: Secondary | ICD-10-CM | POA: Diagnosis not present

## 2020-12-10 DIAGNOSIS — E872 Acidosis: Secondary | ICD-10-CM | POA: Diagnosis not present

## 2020-12-10 DIAGNOSIS — D649 Anemia, unspecified: Secondary | ICD-10-CM | POA: Diagnosis not present

## 2020-12-10 LAB — CBC
HCT: 25.4 % — ABNORMAL LOW (ref 36.0–46.0)
Hemoglobin: 6.9 g/dL — CL (ref 12.0–15.0)
MCH: 17.1 pg — ABNORMAL LOW (ref 26.0–34.0)
MCHC: 27.2 g/dL — ABNORMAL LOW (ref 30.0–36.0)
MCV: 63 fL — ABNORMAL LOW (ref 80.0–100.0)
Platelets: 104 10*3/uL — ABNORMAL LOW (ref 150–400)
RBC: 4.03 MIL/uL (ref 3.87–5.11)
RDW: 31.8 % — ABNORMAL HIGH (ref 11.5–15.5)
WBC: 4.9 10*3/uL (ref 4.0–10.5)
nRBC: 0.6 % — ABNORMAL HIGH (ref 0.0–0.2)

## 2020-12-10 LAB — BASIC METABOLIC PANEL
Anion gap: 9 (ref 5–15)
BUN: 13 mg/dL (ref 6–20)
CO2: 20 mmol/L — ABNORMAL LOW (ref 22–32)
Calcium: 9.2 mg/dL (ref 8.9–10.3)
Chloride: 106 mmol/L (ref 98–111)
Creatinine, Ser: 0.64 mg/dL (ref 0.44–1.00)
GFR, Estimated: >60 mL/min (ref >60)
Glucose, Bld: 90 mg/dL (ref 70–99)
Potassium: 3.7 mmol/L (ref 3.5–5.1)
Sodium: 135 mmol/L (ref 135–145)

## 2020-12-10 LAB — URINALYSIS, ROUTINE W REFLEX MICROSCOPIC
Bilirubin Urine: NEGATIVE
Glucose, UA: NEGATIVE mg/dL
Ketones, ur: NEGATIVE mg/dL
Leukocytes,Ua: NEGATIVE
Nitrite: NEGATIVE
Protein, ur: NEGATIVE mg/dL
Specific Gravity, Urine: 1.014 (ref 1.005–1.030)
pH: 5 (ref 5.0–8.0)

## 2020-12-10 LAB — HEMOGLOBIN AND HEMATOCRIT, BLOOD
HCT: 31.6 % — ABNORMAL LOW (ref 36.0–46.0)
Hemoglobin: 8.8 g/dL — ABNORMAL LOW (ref 12.0–15.0)

## 2020-12-10 LAB — HEPATIC FUNCTION PANEL
ALT: 18 U/L (ref 0–44)
AST: 21 U/L (ref 15–41)
Albumin: 4.1 g/dL (ref 3.5–5.0)
Alkaline Phosphatase: 38 U/L (ref 38–126)
Bilirubin, Direct: 0.2 mg/dL (ref 0.0–0.2)
Indirect Bilirubin: 2.6 mg/dL — ABNORMAL HIGH (ref 0.3–0.9)
Total Bilirubin: 2.8 mg/dL — ABNORMAL HIGH (ref 0.3–1.2)
Total Protein: 7.8 g/dL (ref 6.5–8.1)

## 2020-12-10 LAB — HIV ANTIBODY (ROUTINE TESTING W REFLEX): HIV Screen 4th Generation wRfx: NONREACTIVE

## 2020-12-10 LAB — SARS CORONAVIRUS 2 (TAT 6-24 HRS): SARS Coronavirus 2: NEGATIVE

## 2020-12-10 MED ORDER — ONDANSETRON HCL 4 MG PO TABS: 4.0000 mg | ORAL_TABLET | Freq: Four times a day (QID) | ORAL | 0 refills | Status: DC | PRN

## 2020-12-10 MED ORDER — ACETAMINOPHEN 325 MG PO TABS: 650.0000 mg | ORAL_TABLET | Freq: Four times a day (QID) | ORAL | 0 refills | Status: DC | PRN

## 2020-12-10 MED ORDER — ACETAMINOPHEN 650 MG RE SUPP: 650.0000 mg | Freq: Four times a day (QID) | RECTAL | Status: DC | PRN

## 2020-12-10 MED ORDER — DIPHENHYDRAMINE HCL 50 MG/ML IJ SOLN
50.0000 mg | Freq: Three times a day (TID) | INTRAMUSCULAR | Status: DC | PRN
  Administered 2020-12-10 (×2): 50 mg via INTRAVENOUS
  Filled 2020-12-10 (×2): qty 1

## 2020-12-10 MED ORDER — MEGESTROL ACETATE 40 MG PO TABS: 40.0000 mg | ORAL_TABLET | Freq: Every day | ORAL | 0 refills | Status: DC

## 2020-12-10 MED ORDER — MEGESTROL ACETATE 40 MG PO TABS
40.0000 mg | ORAL_TABLET | Freq: Every day | ORAL | Status: DC
  Administered 2020-12-10: 40 mg via ORAL
  Filled 2020-12-10: qty 1

## 2020-12-10 MED ORDER — DIPHENHYDRAMINE HCL 50 MG/ML IJ SOLN: 25.0000 mg | Freq: Three times a day (TID) | INTRAMUSCULAR | Status: DC | PRN

## 2020-12-10 MED ORDER — HYDRALAZINE HCL 20 MG/ML IJ SOLN
10.0000 mg | Freq: Four times a day (QID) | INTRAMUSCULAR | Status: DC | PRN
  Administered 2020-12-10: 10 mg via INTRAVENOUS
  Filled 2020-12-10: qty 1

## 2020-12-10 MED ORDER — ACETAMINOPHEN 325 MG PO TABS: 650.0000 mg | ORAL_TABLET | Freq: Four times a day (QID) | ORAL | Status: DC | PRN

## 2020-12-10 NOTE — Progress Notes (Signed)
C/o itching to back and neck. On call paged, awaiting new orders.

## 2020-12-10 NOTE — Progress Notes (Signed)
D/C instructions given to patient. Patient had no questions. NT or writer will wheel patient out once she is dressed and her ride is here  

## 2020-12-10 NOTE — Discharge Summary (Signed)
Physician Discharge Summary  Tanya Murray MVH:[id number] DOB: [birth year] DOA: 12/09/2020  PCP: [name], DO  Admit date: 12/09/2020 Discharge date: 12/10/2020  Admitted From: Home Disposition: Home  Recommendations for Outpatient Follow-up:  1. Follow up with PCP in 1-2 weeks 2. Follow up with Gynecology at [facility] for [facility]; Dr. Harolyn Rutherford will arrange follow up and also schedule outpatient MRI 3. Follow up with Hematology Dr. Alvy Bimler for evaluation of Pancytopenia 4. Please obtain CMP/CBC, Mag, Phos in one week 5. Please follow up on the following pending results:  Home Health: No  Equipment/Devices: None  Discharge Condition: Stable CODE STATUS: FULL CODE Diet recommendation: Regular Diet   Brief/Interim Summary: HPI per Dr. Marva Panda on 12/09/20 This is a 45 year old female with no significant past medical history who presents to the ED with abnormal lab work with Hb 4.3. She established care with a PCP yesterday and was found to have anemia on routine lab work obtained today and referred to the ED.  Patient's wife over the phone reports that she has noticed a 10 pound unintentional weight loss over the past 3 to 6 months.  Wife also admits the patient's abdomen has appeared more distended.  She has also noticed that the patient has had shortness of breath on exertion as well as poor p.o. intake for the past several months with intermittent nonbloody vomiting between meals.  She states the patient has been sticking to protein shakes as opposed to solid meals and feels that she gets fuller quicker.  Patient denies any odynophagia and admits to regular nonbloody BMs.  Patient also admits to alcohol use, drinks 1 scotch daily and no history of withdrawal symptoms.  She denies night sweats and no known personal or family history of cancer but does admit to a distant history of uterine fibroids but has not had any intervention or follow-up regarding this and does not have a  gynecologist.  Patient reports heavy bleeding during her menstrual cycle with blood clots.  Menstrual cycle occurs regularly and was most recently 2 weeks ago and lasts 5 days typically.     ED Course: Afebrile, hemodynamically stable, on room air. Notable Labs: T bili 1.8 otherwise CMP unremarkable. WBC 3.0, Hb 4.9, MCV 58, Platelets 145, Iron 20, Saturation 3, Ferritin 2, vitamin B12 328, FOBT negative, COVID 19 pending. 4 units PRBCs ordered.   **Interim History She was given 4 units of PRBCs as well as IV iron.  She improved and the case was discussed with the on-call GYN physician Dr. Harolyn Rutherford who recommended that the patient can be discharged on Megace and have her follow-up closely with the GYN physician and they will arrange for an outpatient MRI and evaluation.  I also spoke with the hematologist who will follow patient up as an outpatient for her pancytopenia.  3 remains stable and is improved and will be discharged on Megace and have close follow-up with PCP, GYN, as well as hematology in the outpatient setting and weeks.  She will have a repeat MRI done given that it was not able to be done because she was given the IV iron.  She has been deemed medically stable for discharge will need to follow-up with the specialist listed as above.  Discharge Diagnoses:  Principal Problem:   Symptomatic anemia Active Problems:   Iron deficiency   Pancytopenia (HCC)   Symptomatic Iron deficiency anemia, improved -Concern for secondary to abnormal uterine bleeding/uterine fibroids vs undiagnosed malignancy given her weight loss,  early satiety and abdominal masses -Iron infusion given this hospitalization and will not place on iron supplementation at discharge with family done by PCP and GYN -PRBC transfusion and she was transfused 4 units of PRBCs; repeat hemoglobin/hematocrit is now 8.8/31.6 -CT abdomen/pelvis with IV/Oral contrast showed "The uterus is markedly enlarged and contains multiple  large, heterogeneous, partially calcified soft tissue masses. The largest masses measure approximately 14.8 cm by 9.6 cm x 16.2 cm, 12.7 cm x 15.6 cm x 13.7 cm and 13.3 cm x 12.5 cm x 9.5 cm. These extend superiorly into the mid to upper abdomen. Mass effect is seen on the urinary bladder. A 2.2 cm x 1.5 cm right adnexal cyst is noted. Other: No abdominal wall hernia or abnormality. No abdominopelvic ascites." -MRI of the Abdomen and Pelvis was ordered for correlation however because she got IV Iron yesterday was not able to be done per [facility] given that it would interfere with the Imaging.  -I spoke with hematology as well and they do not feel that she is hemolyzing but recommended checking haptoglobin which can be done in the outpatient setting; LDH was normal. -FOBT was Negative  -I spoke with the Dr. On Call for GYN Dr. Harolyn Rutherford who recommended discharging home and having patient follow up as an outpatient and Dr. Harolyn Rutherford scheduling MRI for outpatient and close follow-up -Dr. Harolyn Rutherford recommending Megace 40 mg po Daily   Pancytopenia, and improving -Iron/Vitamin deficiency vs malignancy as above  -WBC 3.0, Hb 4.9, platelets 145 on admission and now -Vitamin B12 level borderline low -> MMA level -HIV was Non-reactive  -Follow-up differential -Anemia panel done and showed an iron level of 20, TIBC of 583, TIBC of 603, saturation ratio of 3%, ferritin level of 2, folate level 7.0, vitamin B12 328 -Repeat WBC showed a 4.9, platelet count was 104, and repeat hemoglobin 5 hematocrit with -Have referred to hematology as an outpatient for additional work-up and evaluation -Repeat CBC in the a.m.  Hyperbilirubinemia -In the setting of lysis given her 4 units of PRBCs -I spoke to hematology who feels that she is not having hemolysis given her LDH being normal but they recommend checking a haptoglobin which can be done in outpatient setting -T bili went from one-point about 3.8 and likely in the  setting of old blood but she got -Recommend repeating CMP within 1 week  Metabolic Acidosis -Patient's CO2 was 20, anion gap with 9, chloride level was 165.   -Continue to Monitor and trend and repeat CMP in the outpatient setting  Discharge Instructions  Allergies as of 12/10/2020   No Known Allergies     Medication List    STOP taking these medications   amoxicillin-clavulanate 875-125 MG tablet Commonly known as: AUGMENTIN   ibuprofen 200 MG tablet Commonly known as: ADVIL     TAKE these medications   acetaminophen 325 MG tablet Commonly known as: TYLENOL Take 2 tablets (650 mg total) by mouth every 6 (six) hours as needed for mild pain or fever (Fever >/= 101, prior to blood administration).   megestrol 40 MG tablet Commonly known as: MEGACE Take 1 tablet (40 mg total) by mouth daily.   ondansetron 4 MG tablet Commonly known as: ZOFRAN Take 1 tablet (4 mg total) by mouth every 6 (six) hours as needed for nausea.       No Known Allergies  Consultations:  Discussed with GYN Dr. Harolyn Rutherford  Procedures/Studies: US Abdomen Complete  Result Date: 12/09/2020 CLINICAL DATA:  Suspected  hepatosplenomegaly. EXAM: ABDOMEN ULTRASOUND COMPLETE COMPARISON:  None. FINDINGS: Gallbladder: No gallstones or wall thickening visualized (2.1 mm). No sonographic Murphy sign noted by sonographer. Common bile duct: Diameter: 4.2 mm Liver: No focal lesion identified. Within normal limits in parenchymal echogenicity. Portal vein is patent on color Doppler imaging with normal direction of blood flow towards the liver. IVC: No abnormality visualized. Pancreas: Poorly visualized secondary to overlying bowel gas. Spleen: Size (4.4 cm) and appearance within normal limits (limited in evaluation secondary to overlying bowel gas). Right Kidney: Length: 11.5 cm. Echogenicity within normal limits. No mass or hydronephrosis visualized. Left Kidney: Length: 10.3 cm. Echogenicity within normal limits. No mass  or hydronephrosis visualized. Abdominal aorta: No aneurysm visualized. Other findings: Limited study secondary to the patient's body habitus and overlying bowel gas. Of incidental note is the presence of an enlarged uterus containing uterine fibroids. IMPRESSION: 1. No evidence of hepatosplenomegaly. 2. Enlarged fibroid uterus. Electronically Signed   By: Virgina Norfolk M.D.   On: 12/09/2020 16:18   CT ABDOMEN PELVIS W CONTRAST  Result Date: 12/09/2020 CLINICAL DATA:  Abnormal hemoglobin. EXAM: CT ABDOMEN AND PELVIS WITH CONTRAST TECHNIQUE: Multidetector CT imaging of the abdomen and pelvis was performed using the standard protocol following bolus administration of intravenous contrast. CONTRAST:  172mL OMNIPAQUE IOHEXOL 300 MG/ML  SOLN COMPARISON:  None. FINDINGS: Lower chest: No acute abnormality. Hepatobiliary: The liver parenchyma is heterogeneous in appearance. No focal liver abnormality is seen. No gallstones, gallbladder wall thickening, or biliary dilatation. Pancreas: Unremarkable. No pancreatic ductal dilatation or surrounding inflammatory changes. Spleen: Normal in size without focal abnormality. Adrenals/Urinary Tract: Adrenal glands are unremarkable. Kidneys are normal in size, without renal calculi or hydronephrosis. A 6 mm cystic appearing area is seen within the posterior aspect of the mid right kidney. Bladder is unremarkable. Stomach/Bowel: Stomach is within normal limits. Appendix appears normal. No evidence of bowel wall thickening, distention, or inflammatory changes. Vascular/Lymphatic: No significant vascular findings are present. No enlarged abdominal or pelvic lymph nodes. Reproductive: The uterus is markedly enlarged and contains multiple large, heterogeneous, partially calcified soft tissue masses. The largest masses measure approximately 14.8 cm by 9.6 cm x 16.2 cm, 12.7 cm x 15.6 cm x 13.7 cm and 13.3 cm x 12.5 cm x 9.5 cm. These extend superiorly into the mid to upper abdomen.  Mass effect is seen on the urinary bladder. A 2.2 cm x 1.5 cm right adnexal cyst is noted. Other: No abdominal wall hernia or abnormality. No abdominopelvic ascites. Musculoskeletal: No acute or significant osseous findings. IMPRESSION: 1. Multiple extremely large, partially calcified uterine fibroids. MRI correlation is recommended. 2. Right adnexal cyst, likely ovarian in origin. Electronically Signed   By: Virgina Norfolk M.D.   On: 12/09/2020 17:48    Subjective: Seen and examined at bedside and she states that she is doing better.  Denies any shortness of breath and feels like he is improved.  No chest pain or lightheadedness or dizziness.  No other concerns or complaints at this time.  Discharge Exam: Vitals:   12/10/20 1010 12/10/20 1036  BP: (!) 153/97 (!) 160/100  Pulse: 75 70  Resp: 18 18  Temp: 98.5 F (36.9 C) 98.4 F (36.9 C)  SpO2: 100% 100%   Vitals:   12/10/20 0630 12/10/20 0927 12/10/20 1010 12/10/20 1036  BP: (!) 141/93 (!) 152/97 (!) 153/97 (!) 160/100  Pulse: 71 72 75 70  Resp: 16 18 18 18   Temp: 98.9 F (37.2 C) 98.9 F (37.2 C) 98.5  F (36.9 C) 98.4 F (36.9 C)  TempSrc: Oral Oral Oral Oral  SpO2: 100% 100% 100% 100%  Weight:      Height:       General: Pt is alert, awake, not in acute distress Cardiovascular: RRR, S1/S2 +, no rubs, no gallops Respiratory: CTA bilaterally, no wheezing, no rhonchi Abdominal: Soft, NT, mildly distended secondary to body habitus, bowel sounds + Extremities: No appreciable edema, no cyanosis  The results of significant diagnostics from this hospitalization (including imaging, microbiology, ancillary and laboratory) are listed below for reference.    Microbiology: Recent Results (from the past 240 hour(s))  SARS CORONAVIRUS 2 (TAT 6-24 HRS) Nasopharyngeal Nasopharyngeal Swab     Status: None   Collection Time: 12/09/20  2:11 PM   Specimen: Nasopharyngeal Swab  Result Value Ref Range Status   SARS Coronavirus 2 NEGATIVE  NEGATIVE Final    Comment: (NOTE) SARS-CoV-2 target nucleic acids are NOT DETECTED.  The SARS-CoV-2 RNA is generally detectable in upper and lower respiratory specimens during the acute phase of infection. Negative results do not preclude SARS-CoV-2 infection, do not rule out co-infections with other pathogens, and should not be used as the sole basis for treatment or other patient management decisions. Negative results must be combined with clinical observations, patient history, and epidemiological information. The expected result is Negative.  Fact Sheet for Patients: SugarRoll.be  Fact Sheet for Healthcare Providers: https://www.woods-mathews.com/  This test is not yet approved or cleared by the [place] FDA and  has been authorized for detection and/or diagnosis of SARS-CoV-2 by FDA under an Emergency Use Authorization (EUA). This EUA will remain  in effect (meaning this test can be used) for the duration of the COVID-19 declaration under Se ction 564(b)(1) of the Act, 21 U.S.C. section 360bbb-3(b)(1), unless the authorization is terminated or revoked sooner.  Performed at [facility], [place] [address] [place], [place] [address]     Labs: BNP (last 3 results) No results for input(s): BNP in the last 8760 hours. Basic Metabolic Panel: Recent Labs  Lab 12/09/20 1326 12/10/20 0422  NA 138 135  K 3.8 3.7  CL 107 106  CO2 22 20*  GLUCOSE 94 90  BUN 13 13  CREATININE 0.65 0.64  CALCIUM 9.8 9.2   Liver Function Tests: Recent Labs  Lab 12/09/20 1326 12/10/20 0422  AST 20 21  ALT 17 18  ALKPHOS 47 38  BILITOT 1.8* 2.8*  PROT 8.9* 7.8  ALBUMIN 4.6 4.1   No results for input(s): LIPASE, AMYLASE in the last 168 hours. No results for input(s): AMMONIA in the last 168 hours. CBC: Recent Labs  Lab 12/09/20 1326 12/10/20 0422  WBC 3.0* 4.9  NEUTROABS 1.3*  --   HGB 4.9* 6.9*  HCT 21.1* 25.4*  MCV 58.0*  63.0*  PLT 145* 104*   Cardiac Enzymes: No results for input(s): CKTOTAL, CKMB, CKMBINDEX, TROPONINI in the last 168 hours. BNP: Invalid input(s): POCBNP CBG: No results for input(s): GLUCAP in the last 168 hours. D-Dimer No results for input(s): DDIMER in the last 72 hours. Hgb A1c No results for input(s): HGBA1C in the last 72 hours. Lipid Profile No results for input(s): CHOL, HDL, LDLCALC, TRIG, CHOLHDL, LDLDIRECT in the last 72 hours. Thyroid function studies No results for input(s): TSH, T4TOTAL, T3FREE, THYROIDAB in the last 72 hours.  Invalid input(s): FREET3 Anemia work up Recent Labs    12/09/20 1326 12/09/20 1327  VITAMINB12  --  328  FOLATE 7.0  --  FERRITIN  --  2*  TIBC  --  603*  IRON  --  20*  RETICCTPCT  --  1.6   Urinalysis    Component Value Date/Time   COLORURINE YELLOW 12/10/2020 0545   APPEARANCEUR CLEAR 12/10/2020 0545   LABSPEC 1.014 12/10/2020 0545   PHURINE 5.0 12/10/2020 0545   GLUCOSEU NEGATIVE 12/10/2020 0545   HGBUR SMALL (A) 12/10/2020 0545   BILIRUBINUR NEGATIVE 12/10/2020 0545   KETONESUR NEGATIVE 12/10/2020 0545   PROTEINUR NEGATIVE 12/10/2020 0545   NITRITE NEGATIVE 12/10/2020 0545   LEUKOCYTESUR NEGATIVE 12/10/2020 0545   Sepsis Labs Invalid input(s): PROCALCITONIN,  WBC,  LACTICIDVEN Microbiology Recent Results (from the past 240 hour(s))  SARS CORONAVIRUS 2 (TAT 6-24 HRS) Nasopharyngeal Nasopharyngeal Swab     Status: None   Collection Time: 12/09/20  2:11 PM   Specimen: Nasopharyngeal Swab  Result Value Ref Range Status   SARS Coronavirus 2 NEGATIVE NEGATIVE Final    Comment: (NOTE) SARS-CoV-2 target nucleic acids are NOT DETECTED.  The SARS-CoV-2 RNA is generally detectable in upper and lower respiratory specimens during the acute phase of infection. Negative results do not preclude SARS-CoV-2 infection, do not rule out co-infections with other pathogens, and should not be used as the sole basis for treatment or  other patient management decisions. Negative results must be combined with clinical observations, patient history, and epidemiological information. The expected result is Negative.  Fact Sheet for Patients: SugarRoll.be  Fact Sheet for Healthcare Providers: https://www.woods-mathews.com/  This test is not yet approved or cleared by the [place] FDA and  has been authorized for detection and/or diagnosis of SARS-CoV-2 by FDA under an Emergency Use Authorization (EUA). This EUA will remain  in effect (meaning this test can be used) for the duration of the COVID-19 declaration under Se ction 564(b)(1) of the Act, 21 U.S.C. section 360bbb-3(b)(1), unless the authorization is terminated or revoked sooner.  Performed at [facility], [place] [address], [place], [place] [address]    Time coordinating discharge: 35 minutes  SIGNED:  Kerney Elbe, DO [facility] 12/10/2020, 12:47 PM Pager is on [place]  If 7PM-7AM, please contact night-coverage [email or web address]

## 2020-12-11 LAB — BPAM RBC
Blood Product Expiration Date: 202204062359
Blood Product Expiration Date: 202204062359
Blood Product Expiration Date: 202204062359
Blood Product Expiration Date: 202204072359
ISSUE DATE / TIME: 202203101608
ISSUE DATE / TIME: 202203102227
ISSUE DATE / TIME: 202203110610
ISSUE DATE / TIME: 202203111010
Unit Type and Rh: 6200
Unit Type and Rh: 6200
Unit Type and Rh: 6200
Unit Type and Rh: 6200

## 2020-12-11 LAB — TYPE AND SCREEN
ABO/RH(D): A POS
Antibody Screen: NEGATIVE
Unit division: 0
Unit division: 0
Unit division: 0
Unit division: 0

## 2020-12-14 LAB — METHYLMALONIC ACID, SERUM: Methylmalonic Acid, Quantitative: 188 nmol/L (ref 0–378)

## 2020-12-15 ENCOUNTER — Telehealth: Payer: Self-pay | Admitting: Physician Assistant

## 2020-12-15 LAB — TRANSFUSION REACTION
DAT C3: NEGATIVE
Post RXN DAT IgG: NEGATIVE

## 2020-12-15 NOTE — Telephone Encounter (Signed)
Received a call from Ms. Tanya Murray for severe anemia. I returned her call and scheduled her to see Cassie on 3/17 at 9am w/labs 830am. Pt aware to arrive 20 minutes early.

## 2020-12-15 NOTE — Progress Notes (Signed)
[place] [phone]   [phone]  CONSULT NOTE  REFERRING PHYSICIAN: [facility]   REASON FOR CONSULTATION:  Iron Deficiency Anemia   HPI Tanya Murray is a 45 y.o. female with no significant past medical history except for large uterine fibroids is referred to the clinic for iron deficiency anemia.   The patient was establishing care with a new PCP on 12/08/20 when routine labs noted significant anemia with a Hbg of ~4. The patient was subsequently referred to the ER for further management. The patient was relatively asymptomatic and reported some chronic fatigue but nothing unusual. The patient did not notice any dyspnea on exertion or 10 lb weight loss (over 3-6 months), or abdominal distention, but her wife noted these symptoms reportedly. She also had intermittent nonbloody vomiting between meals the last several months. The patient had a CT of the abdomen and pelvis performed which demonstrated "The uterus is markedly enlarged and contains multiple large, heterogeneous, partially calcified soft tissue masses. The largest masses measure approximately 14.8 cm by 9.6 cm x 16.2 cm, 12.7 cm x 15.6 cm x 13.7 cm and 13.3 cm x 12.5 cm x 9.5 cm. These extend superiorly into the mid to upper abdomen. Mass effect is seen on the urinary bladder.A 2.2 cm x 1.5 cm right adnexal cyst is noted. No abdominal wall hernia or abnormality. No abdominopelvic ascites."  The patient reports menorrhagia with her menstrual cycles with blood clots. She mentioned to me that she has a menstral cycle every 28 days. Her periods last 5 days but she reports heavy bleeding the first 3 days which requires the ultra sized tampons. She states that she sometimes has to use two of them at a time. She estimates she has to change them 6x per day. Labs performed showed T bili 1.8 otherwise CMP unremarkable. WBC 3.0, Hb 4.9, MCV 58, Platelets 145, Iron 20, Saturation 3, Ferritin 2, vitamin B12  328,  folate level 7.0, FOBT negative. She received 4 units of RBCs during her hospitalization. Discharge note mentions IV iron; however, I do not see which IV iron she received.  The patient was discharged with megace 40 mg daily and close follow up with PCP and GYN. She has an appointment with GYN on 12/20/20.   Otherwise, regarding her anemia/pancytopenia, hematology was consulted in the hospital. Due to her elevated bilirubin and pancytopenia, there was initial concern about malignancy and hemolysis. Her LDH was normal so hematology had a low suspicion for hemolysis. Her WBC and platelets recovered during the admission.   She was subsequently referred to the clinic for management of her anemia.   There are no prior lab records to compare too. The patient is unsure how long she has been anemic for. She believes she heard the word anemia mentioned to her throughout her life but is unsure. She has never required an iron infusion before or a blood transfusion prior to her recent admission.She denies blood thinner use. She was not discharged on an oral iron supplement and does not take iron. She reports chronic fatigue which she has not noticed an appreciable change in her energy since receiving blood. She denies lightheadedness/dizziness.  She denies significantshortness of breath,chest pain or palpitations. She doesnotcrave ice. She denies any other abnormal bleeding including hemoptysis, hematemesis, melena, epistaxis, or hematuria.  No major bruising/echymosis at this time. She denies any NSAID use except during her menstrual cycles.She denies any particular dietary habits such as being a vegan  or vegetarian; however, she does not eat a diet rich in iron and does not eat pork or beef based on preference.  The patient denies any history of any bariatric surgeries. She denies any fevers, night sweats, or lymphadenopathy. She has never had a colonoscopy.   She is adopted and does not know her family  medical history. She is a Animal nutritionist. She is married. She denies drug or tobacco use. She drinks scotch about 5x per week.   HPI  No past medical history on file.  No past surgical history on file.  No family history on file.  Social History Social History   Tobacco Use  . Smoking status: Never Smoker  . Smokeless tobacco: Never Used  Substance Use Topics  . Alcohol use: Yes    No Known Allergies  Current Outpatient Medications  Medication Sig Dispense Refill  . acetaminophen (TYLENOL) 325 MG tablet Take 2 tablets (650 mg total) by mouth every 6 (six) hours as needed for mild pain or fever (Fever >/= 101, prior to blood administration). 20 tablet 0  . FeFum-FePoly-FA-B Cmp-C-Biot (INTEGRA PLUS) CAPS Take 1 capsule by mouth every morning. 30 capsule 2  . megestrol (MEGACE) 40 MG tablet Take 1 tablet (40 mg total) by mouth daily. 30 tablet 0  . ondansetron (ZOFRAN) 4 MG tablet Take 1 tablet (4 mg total) by mouth every 6 (six) hours as needed for nausea. 20 tablet 0   No current facility-administered medications for this visit.    REVIEW OF SYSTEMS:   Review of Systems  Constitutional: Positive for chronic fatigue. Negative for appetite change, chills, fever and unexpected weight change.  HENT: Negative for mouth sores, nosebleeds, sore throat and trouble swallowing.   Eyes: Negative for eye problems and icterus.  Respiratory: Negative for cough, hemoptysis, shortness of breath and wheezing.   Cardiovascular: Negative for chest pain and leg swelling.  Gastrointestinal: Negative for abdominal pain, constipation, diarrhea, nausea and vomiting.  Genitourinary: Positive for menorrhagia. Negative for bladder incontinence, difficulty urinating, dysuria, frequency and hematuria.   Musculoskeletal: Negative for back pain, gait problem, neck pain and neck stiffness.  Skin: Negative for itching and rash.  Neurological: Negative for dizziness, extremity weakness, gait problem,  headaches, light-headedness and seizures.  Hematological: Negative for adenopathy. Does not bruise/bleed easily.  Psychiatric/Behavioral: Negative for confusion, depression and sleep disturbance. The patient is not nervous/anxious.     PHYSICAL EXAMINATION:  Blood pressure (!) 158/88, pulse 70, temperature 98 F (36.7 C), temperature source Tympanic, resp. rate 18, height 5\' 10"  (1.778 m), weight 189 lb 6.4 oz (85.9 kg), last menstrual period 11/25/2020, SpO2 100 %.  ECOG PERFORMANCE STATUS: 1 - Symptomatic but completely ambulatory  Physical Exam  Constitutional: Oriented to person, place, and time and well-developed, well-nourished, and in no distress.  HENT:  Head: Normocephalic and atraumatic.  Mouth/Throat: Oropharynx is clear and moist. No oropharyngeal exudate.  Eyes: Conjunctivae are normal. Right eye exhibits no discharge. Left eye exhibits no discharge. No scleral icterus.  Neck: Normal range of motion. Neck supple.  Cardiovascular: Normal rate, regular rhythm, normal heart sounds and intact distal pulses.   Pulmonary/Chest: Effort normal and breath sounds normal. No respiratory distress. No wheezes. No rales.  Abdominal: Soft. Bowel sounds are normal. Exhibits no distension and no mass. There is no tenderness. Uterus palpable near umbilicus.  Musculoskeletal: Normal range of motion. Exhibits no edema.  Lymphadenopathy:    No cervical adenopathy.  Neurological: Alert and oriented to person, place, and time.  Exhibits normal muscle tone. Gait normal. Coordination normal.  Skin: Skin is warm and dry. No rash noted. Not diaphoretic. No erythema. No pallor.  Psychiatric: Mood, memory and judgment normal.  Vitals reviewed.  LABORATORY DATA: Lab Results  Component Value Date   WBC 5.1 12/16/2020   HGB 9.6 (L) 12/16/2020   HCT 35.1 (L) 12/16/2020   MCV 71.2 (L) 12/16/2020   PLT 167 12/16/2020      Chemistry      Component Value Date/Time   NA 139 12/16/2020 0830   K 4.0  12/16/2020 0830   CL 109 12/16/2020 0830   CO2 23 12/16/2020 0830   BUN 9 12/16/2020 0830   CREATININE 0.80 12/16/2020 0830      Component Value Date/Time   CALCIUM 9.4 12/16/2020 0830   ALKPHOS 56 12/16/2020 0830   AST 37 12/16/2020 0830   ALT 38 12/16/2020 0830   BILITOT 1.0 12/16/2020 0830       RADIOGRAPHIC STUDIES: US Abdomen Complete  Result Date: 12/09/2020 CLINICAL DATA:  Suspected hepatosplenomegaly. EXAM: ABDOMEN ULTRASOUND COMPLETE COMPARISON:  None. FINDINGS: Gallbladder: No gallstones or wall thickening visualized (2.1 mm). No sonographic Murphy sign noted by sonographer. Common bile duct: Diameter: 4.2 mm Liver: No focal lesion identified. Within normal limits in parenchymal echogenicity. Portal vein is patent on color Doppler imaging with normal direction of blood flow towards the liver. IVC: No abnormality visualized. Pancreas: Poorly visualized secondary to overlying bowel gas. Spleen: Size (4.4 cm) and appearance within normal limits (limited in evaluation secondary to overlying bowel gas). Right Kidney: Length: 11.5 cm. Echogenicity within normal limits. No mass or hydronephrosis visualized. Left Kidney: Length: 10.3 cm. Echogenicity within normal limits. No mass or hydronephrosis visualized. Abdominal aorta: No aneurysm visualized. Other findings: Limited study secondary to the patient's body habitus and overlying bowel gas. Of incidental note is the presence of an enlarged uterus containing uterine fibroids. IMPRESSION: 1. No evidence of hepatosplenomegaly. 2. Enlarged fibroid uterus. Electronically Signed   By: Virgina Norfolk M.D.   On: 12/09/2020 16:18   CT ABDOMEN PELVIS W CONTRAST  Result Date: 12/09/2020 CLINICAL DATA:  Abnormal hemoglobin. EXAM: CT ABDOMEN AND PELVIS WITH CONTRAST TECHNIQUE: Multidetector CT imaging of the abdomen and pelvis was performed using the standard protocol following bolus administration of intravenous contrast. CONTRAST:  172mL OMNIPAQUE  IOHEXOL 300 MG/ML  SOLN COMPARISON:  None. FINDINGS: Lower chest: No acute abnormality. Hepatobiliary: The liver parenchyma is heterogeneous in appearance. No focal liver abnormality is seen. No gallstones, gallbladder wall thickening, or biliary dilatation. Pancreas: Unremarkable. No pancreatic ductal dilatation or surrounding inflammatory changes. Spleen: Normal in size without focal abnormality. Adrenals/Urinary Tract: Adrenal glands are unremarkable. Kidneys are normal in size, without renal calculi or hydronephrosis. A 6 mm cystic appearing area is seen within the posterior aspect of the mid right kidney. Bladder is unremarkable. Stomach/Bowel: Stomach is within normal limits. Appendix appears normal. No evidence of bowel wall thickening, distention, or inflammatory changes. Vascular/Lymphatic: No significant vascular findings are present. No enlarged abdominal or pelvic lymph nodes. Reproductive: The uterus is markedly enlarged and contains multiple large, heterogeneous, partially calcified soft tissue masses. The largest masses measure approximately 14.8 cm by 9.6 cm x 16.2 cm, 12.7 cm x 15.6 cm x 13.7 cm and 13.3 cm x 12.5 cm x 9.5 cm. These extend superiorly into the mid to upper abdomen. Mass effect is seen on the urinary bladder. A 2.2 cm x 1.5 cm right adnexal cyst is noted. Other: No abdominal wall  hernia or abnormality. No abdominopelvic ascites. Musculoskeletal: No acute or significant osseous findings. IMPRESSION: 1. Multiple extremely large, partially calcified uterine fibroids. MRI correlation is recommended. 2. Right adnexal cyst, likely ovarian in origin. Electronically Signed   By: Virgina Norfolk M.D.   On: 12/09/2020 17:48    ASSESSMENT: This is a very pleasant 45 year old female with iron deficiency anemia due to menorrhagia from fibroids.    PLAN: The patient was seen with Dr. Julien Nordmann today. The patient had several labs performed including CBC, CMP, haptoglobin, sample to blood  bank, and SPEP. The patient's labs today demonstrate a normal WBC and platelet count. She has signifciant improvement in her Hbg which is 9.6 today. Her MCV continues to be low but is improving. CMP unremarkable. No elevated bilirubin. No elevated protein.   Due to her signifciant iron deficiency anemia, Dr. Julien Nordmann recommends that we arrange for IV iron with vernofer 300 mg weekly x3. I will arrange for her to receive her first infusion next week.   We will see her back for a follow up appointment in 8 weeks for evaluation and repeat CBC, iron studies, and ferritin.   The patient was instructed to start taking iron supplements daily. I have sent a prescription for iron. She was encouraged to take this with vitamin C. She was also given a handout on iron rich food.   She was instructed to follow up with GYN for close monitoring and management of her menorrhagia and fibroids.   The patient voices understanding of current disease status and treatment options and is in agreement with the current care plan.  All questions were answered. The patient knows to call the clinic with any problems, questions or concerns. We can certainly see the patient much sooner if necessary.  Thank you so much for allowing me to participate in the care of Tanya Murray. I will continue to follow up the patient with you and assist in her care.  I spent 40-54 minutes in this encounter.   Disclaimer: This note was dictated with voice recognition software. Similar sounding words can inadvertently be transcribed and may not be corrected upon review.   Manus Weedman L Janera Peugh December 16, 2020, 10:05 AM  ADDENDUM: Hematology/oncology Attending: I had a face-to-face encounter with the patient today.  I reviewed her record, lab and recommended her care plan.  This is a very pleasant 45 years old African-American female with no significant past medical history except for large uterine fibroids that was discovered recently  when the patient was seen by her primary care physician to establish care and was found to have significant anemia with hemoglobin down to 4.9.  She presented to the emergency department for evaluation and repeat CBC showed white blood count of 3.0, hemoglobin 4.9, hematocrit 21.1% with platelets count of 1 45,000.  She had several studies performed at that time that showed normal vitamin B12 and serum folate.  She had low ferritin level of 2.  Iron studies showed serum iron of 20 with iron saturation of 3% and total iron binding capacity of 603.  Her reticulocyte was elevated.  The patient received 4 units of PRBCs transfusion at that time.  She also received 1 unit of iron infusion.  She was referred to [place] today for evaluation and recommendation regarding her condition and iron infusion if needed. She is feeling a little bit better today. Repeat CBC today showed hemoglobin of 9.6 and hematocrit 35.1% with MCV of 71.2.  She had  normal white blood count and platelets count. I had a lengthy discussion with the patient about her condition.  I recommended for the patient to proceed with additional iron infusion with Venofer 300 mg IV for 3 weekly doses. For the uterine fibroid and menorrhagia, the patient is seen by gynecology and currently on Megace. We will see the patient back for follow-up visit in around 2 months for evaluation and repeat CBC, iron study and ferritin for evaluation of her response to the iron infusion and additional infusion if needed. The patient was advised to call immediately if she has any other concerning symptoms in the interval. The total time spent in the appointment was 60 minutes.  Disclaimer: This note was dictated with voice recognition software. Similar sounding words can inadvertently be transcribed and may be missed upon review. Eilleen Kempf, MD 12/16/20

## 2020-12-16 ENCOUNTER — Other Ambulatory Visit: Payer: Self-pay | Admitting: Physician Assistant

## 2020-12-16 ENCOUNTER — Inpatient Hospital Stay: Payer: BC Managed Care – PPO | Attending: Physician Assistant | Admitting: Physician Assistant

## 2020-12-16 ENCOUNTER — Inpatient Hospital Stay: Payer: BC Managed Care – PPO

## 2020-12-16 ENCOUNTER — Other Ambulatory Visit: Payer: Self-pay

## 2020-12-16 VITALS — BP 158/88 | HR 70 | Temp 98.0°F | Resp 18 | Ht 70.0 in | Wt 189.4 lb

## 2020-12-16 DIAGNOSIS — E611 Iron deficiency: Secondary | ICD-10-CM | POA: Diagnosis not present

## 2020-12-16 DIAGNOSIS — Z79899 Other long term (current) drug therapy: Secondary | ICD-10-CM | POA: Diagnosis not present

## 2020-12-16 DIAGNOSIS — D259 Leiomyoma of uterus, unspecified: Secondary | ICD-10-CM | POA: Insufficient documentation

## 2020-12-16 DIAGNOSIS — D500 Iron deficiency anemia secondary to blood loss (chronic): Secondary | ICD-10-CM

## 2020-12-16 DIAGNOSIS — N920 Excessive and frequent menstruation with regular cycle: Secondary | ICD-10-CM | POA: Insufficient documentation

## 2020-12-16 LAB — CBC WITH DIFFERENTIAL (CANCER CENTER ONLY)
Abs Immature Granulocytes: 0.01 10*3/uL (ref 0.00–0.07)
Basophils Absolute: 0 10*3/uL (ref 0.0–0.1)
Basophils Relative: 1 %
Eosinophils Absolute: 0.3 10*3/uL (ref 0.0–0.5)
Eosinophils Relative: 6 %
HCT: 35.1 % — ABNORMAL LOW (ref 36.0–46.0)
Hemoglobin: 9.6 g/dL — ABNORMAL LOW (ref 12.0–15.0)
Immature Granulocytes: 0 %
Lymphocytes Relative: 20 %
Lymphs Abs: 1 10*3/uL (ref 0.7–4.0)
MCH: 19.5 pg — ABNORMAL LOW (ref 26.0–34.0)
MCHC: 27.4 g/dL — ABNORMAL LOW (ref 30.0–36.0)
MCV: 71.2 fL — ABNORMAL LOW (ref 80.0–100.0)
Monocytes Absolute: 0.3 10*3/uL (ref 0.1–1.0)
Monocytes Relative: 6 %
Neutro Abs: 3.4 10*3/uL (ref 1.7–7.7)
Neutrophils Relative %: 67 %
Platelet Count: 167 10*3/uL (ref 150–400)
RBC: 4.93 MIL/uL (ref 3.87–5.11)
RDW: 36.9 % — ABNORMAL HIGH (ref 11.5–15.5)
WBC Count: 5.1 10*3/uL (ref 4.0–10.5)
nRBC: 0 % (ref 0.0–0.2)

## 2020-12-16 LAB — SAMPLE TO BLOOD BANK

## 2020-12-16 LAB — CMP (CANCER CENTER ONLY)
ALT: 38 U/L (ref 0–44)
AST: 37 U/L (ref 15–41)
Albumin: 4.2 g/dL (ref 3.5–5.0)
Alkaline Phosphatase: 56 U/L (ref 38–126)
Anion gap: 7 (ref 5–15)
BUN: 9 mg/dL (ref 6–20)
CO2: 23 mmol/L (ref 22–32)
Calcium: 9.4 mg/dL (ref 8.9–10.3)
Chloride: 109 mmol/L (ref 98–111)
Creatinine: 0.8 mg/dL (ref 0.44–1.00)
GFR, Estimated: >60 mL/min (ref >60)
Glucose, Bld: 82 mg/dL (ref 70–99)
Potassium: 4 mmol/L (ref 3.5–5.1)
Sodium: 139 mmol/L (ref 135–145)
Total Bilirubin: 1 mg/dL (ref 0.3–1.2)
Total Protein: 8 g/dL (ref 6.5–8.1)

## 2020-12-16 MED ORDER — INTEGRA PLUS PO CAPS: 1.0000 | ORAL_CAPSULE | Freq: Every morning | ORAL | 2 refills | Status: AC

## 2020-12-16 NOTE — Patient Instructions (Signed)
-Ferrous sulfate: Over the counter at most drug stores  Iron-Rich Diet  Iron is a mineral that helps your body to produce hemoglobin. Hemoglobin is a protein in red blood cells that carries oxygen to your body's tissues. Eating too little iron may cause you to feel weak and tired, and it can increase your risk of infection. Iron is naturally found in many foods, and many foods have iron added to them (iron-fortified foods). You may need to follow an iron-rich diet if you do not have enough iron in your body due to certain medical conditions. The amount of iron that you need each day depends on your age, your sex, and any medical conditions you have. Follow instructions from your health care provider or a diet and nutrition specialist (dietitian) about how much iron you should eat each day. What are tips for following this plan? Reading food labels  Check food labels to see how many milligrams (mg) of iron are in each serving. Cooking  Cook foods in pots and pans that are made from iron.  Take these steps to make it easier for your body to absorb iron from certain foods: ? Soak beans overnight before cooking. ? Soak whole grains overnight and drain them before using. ? Ferment flours before baking, such as by using yeast in bread dough. Meal planning  When you eat foods that contain iron, you should eat them with foods that are high in vitamin C. These include oranges, peppers, tomatoes, potatoes, and mango. Vitamin C helps your body to absorb iron. General information  Take iron supplements only as told by your health care provider. An overdose of iron can be life-threatening. If you were prescribed iron supplements, take them with orange juice or a vitamin C supplement.  When you eat iron-fortified foods or take an iron supplement, you should also eat foods that naturally contain iron, such as meat, poultry, and fish. Eating naturally iron-rich foods helps your body to absorb the iron that  is added to other foods or contained in a supplement.  Certain foods and drinks prevent your body from absorbing iron properly. Avoid eating these foods in the same meal as iron-rich foods or with iron supplements. These foods include: ? Coffee, black tea, and red wine. ? Milk, dairy products, and foods that are high in calcium. ? Beans and soybeans. ? Whole grains. What foods should I eat? Fruits Prunes. Raisins. Eat fruits high in vitamin C, such as oranges, grapefruits, and strawberries, alongside iron-rich foods. Vegetables Spinach (cooked). Green peas. Broccoli. Fermented vegetables. Eat vegetables high in vitamin C, such as leafy greens, potatoes, bell peppers, and tomatoes, alongside iron-rich foods. Grains Iron-fortified breakfast cereal. Iron-fortified whole-wheat bread. Enriched rice. Sprouted grains. Meats and other proteins Beef liver. Oysters. Beef. Shrimp. [place]. Chicken. [place]. Sardines. Chickpeas. Nuts. Tofu. Pumpkin seeds. Beverages Tomato juice. Fresh orange juice. Prune juice. Hibiscus tea. Fortified instant breakfast shakes. Sweets and desserts Blackstrap molasses. Seasonings and condiments Tahini. Fermented soy sauce. Other foods Wheat germ. The items listed above may not be a complete list of recommended foods and beverages. Contact a dietitian for more information. What foods should I avoid? Grains Whole grains. Bran cereal. Bran flour. Oats. Meats and other proteins Soybeans. Products made from soy protein. Black beans. Lentils. Mung beans. Split peas. Dairy Milk. Cream. Cheese. Yogurt. Cottage cheese. Beverages Coffee. Black tea. Red wine. Sweets and desserts Cocoa. Chocolate. Ice cream. Other foods Basil. Oregano. Large amounts of parsley. The items listed above may not  be a complete list of foods and beverages to avoid. Contact a dietitian for more information. Summary  Iron is a mineral that helps your body to produce hemoglobin. Hemoglobin is a  protein in red blood cells that carries oxygen to your body's tissues.  Iron is naturally found in many foods, and many foods have iron added to them (iron-fortified foods).  When you eat foods that contain iron, you should eat them with foods that are high in vitamin C. Vitamin C helps your body to absorb iron.  Certain foods and drinks prevent your body from absorbing iron properly, such as whole grains and dairy products. You should avoid eating these foods in the same meal as iron-rich foods or with iron supplements. This information is not intended to replace advice given to you by your health care provider. Make sure you discuss any questions you have with your health care provider. Document Revised: 08/31/2017 Document Reviewed: 08/14/2017 Elsevier Patient Education  2021 [employer].

## 2020-12-17 LAB — HAPTOGLOBIN: Haptoglobin: 70 mg/dL (ref 42–296)

## 2020-12-20 ENCOUNTER — Telehealth: Payer: Self-pay | Admitting: Internal Medicine

## 2020-12-20 ENCOUNTER — Ambulatory Visit (HOSPITAL_BASED_OUTPATIENT_CLINIC_OR_DEPARTMENT_OTHER): Payer: BC Managed Care – PPO | Admitting: Obstetrics & Gynecology

## 2020-12-20 NOTE — Telephone Encounter (Signed)
Scheduled per 3/17 los. Called pt and left a msg  

## 2020-12-23 ENCOUNTER — Other Ambulatory Visit: Payer: Self-pay | Admitting: Physician Assistant

## 2020-12-23 DIAGNOSIS — R778 Other specified abnormalities of plasma proteins: Secondary | ICD-10-CM

## 2020-12-23 LAB — PROTEIN ELECTROPHORESIS, SERUM, WITH REFLEX
A/G Ratio: 1.1 (ref 0.7–1.7)
Albumin ELP: 3.9 g/dL (ref 2.9–4.4)
Alpha-1-Globulin: 0.3 g/dL (ref 0.0–0.4)
Alpha-2-Globulin: 0.6 g/dL (ref 0.4–1.0)
Beta Globulin: 1 g/dL (ref 0.7–1.3)
Gamma Globulin: 1.6 g/dL (ref 0.4–1.8)
Globulin, Total: 3.5 g/dL (ref 2.2–3.9)
M-Spike, %: 0.4 g/dL — ABNORMAL HIGH
SPEP Interpretation: 0
Total Protein ELP: 7.4 g/dL (ref 6.0–8.5)

## 2020-12-23 LAB — IMMUNOFIXATION REFLEX, SERUM
IgA: 384 mg/dL — ABNORMAL HIGH (ref 87–352)
IgG (Immunoglobin G), Serum: 1739 mg/dL — ABNORMAL HIGH (ref 586–1602)
IgM (Immunoglobulin M), Srm: 154 mg/dL (ref 26–217)

## 2020-12-27 ENCOUNTER — Inpatient Hospital Stay: Payer: BC Managed Care – PPO

## 2020-12-27 ENCOUNTER — Other Ambulatory Visit: Payer: Self-pay

## 2020-12-27 VITALS — BP 148/82 | HR 80 | Temp 98.1°F | Resp 18

## 2020-12-27 DIAGNOSIS — D500 Iron deficiency anemia secondary to blood loss (chronic): Secondary | ICD-10-CM | POA: Diagnosis not present

## 2020-12-27 DIAGNOSIS — R778 Other specified abnormalities of plasma proteins: Secondary | ICD-10-CM

## 2020-12-27 DIAGNOSIS — E611 Iron deficiency: Secondary | ICD-10-CM

## 2020-12-27 MED ORDER — SODIUM CHLORIDE 0.9 % IV SOLN
300.0000 mg | Freq: Once | INTRAVENOUS | Status: AC
  Administered 2020-12-27: 300 mg via INTRAVENOUS
  Filled 2020-12-27: qty 10

## 2020-12-27 MED ORDER — ACETAMINOPHEN 325 MG PO TABS
650.0000 mg | ORAL_TABLET | Freq: Once | ORAL | Status: AC
  Administered 2020-12-27: 650 mg via ORAL

## 2020-12-27 MED ORDER — DIPHENHYDRAMINE HCL 50 MG/ML IJ SOLN
25.0000 mg | Freq: Once | INTRAMUSCULAR | Status: AC
  Administered 2020-12-27: 25 mg via INTRAVENOUS

## 2020-12-27 MED ORDER — SODIUM CHLORIDE 0.9 % IV SOLN
Freq: Once | INTRAVENOUS | Status: AC
  Filled 2020-12-27: qty 250

## 2020-12-27 MED ORDER — DIPHENHYDRAMINE HCL 50 MG/ML IJ SOLN
INTRAMUSCULAR | Status: AC
  Filled 2020-12-27: qty 1

## 2020-12-27 MED ORDER — ACETAMINOPHEN 325 MG PO TABS
ORAL_TABLET | ORAL | Status: AC
  Filled 2020-12-27: qty 2

## 2020-12-27 NOTE — Patient Instructions (Signed)

## 2020-12-27 NOTE — Progress Notes (Signed)
Patient monitored for 30 minutes post infusion with no complaints. VSS upon leaving infusion room.

## 2020-12-28 ENCOUNTER — Telehealth: Payer: Self-pay | Admitting: Physician Assistant

## 2020-12-28 LAB — BETA 2 MICROGLOBULIN, SERUM: Beta-2 Microglobulin: 1.5 mg/L (ref 0.6–2.4)

## 2020-12-28 LAB — KAPPA/LAMBDA LIGHT CHAINS
Kappa free light chain: 38 mg/L — ABNORMAL HIGH (ref 3.3–19.4)
Kappa, lambda light chain ratio: 1.68 — ABNORMAL HIGH (ref 0.26–1.65)
Lambda free light chains: 22.6 mg/L (ref 5.7–26.3)

## 2020-12-28 NOTE — Telephone Encounter (Signed)
Opened in error

## 2020-12-29 ENCOUNTER — Other Ambulatory Visit: Payer: Self-pay

## 2020-12-29 DIAGNOSIS — D500 Iron deficiency anemia secondary to blood loss (chronic): Secondary | ICD-10-CM | POA: Diagnosis not present

## 2020-12-29 DIAGNOSIS — R778 Other specified abnormalities of plasma proteins: Secondary | ICD-10-CM

## 2020-12-30 LAB — UPEP/TP, 24-HR URINE
Albumin, U: 44.2 %
Alpha 1, Urine: 4.7 %
Alpha 2, Urine: 10.3 %
Beta, Urine: 23.5 %
Gamma Globulin, Urine: 17.3 %
Total Protein, Urine-Ur/day: 181 mg/24 hr — ABNORMAL HIGH (ref 30–150)
Total Protein, Urine: 17.2 mg/dL
Total Volume: 1050

## 2021-01-03 ENCOUNTER — Inpatient Hospital Stay: Payer: BC Managed Care – PPO | Attending: Physician Assistant

## 2021-01-03 ENCOUNTER — Other Ambulatory Visit: Payer: Self-pay

## 2021-01-03 VITALS — BP 170/86 | HR 63 | Temp 98.4°F | Resp 16

## 2021-01-03 DIAGNOSIS — D500 Iron deficiency anemia secondary to blood loss (chronic): Secondary | ICD-10-CM | POA: Insufficient documentation

## 2021-01-03 DIAGNOSIS — E611 Iron deficiency: Secondary | ICD-10-CM

## 2021-01-03 DIAGNOSIS — Z79899 Other long term (current) drug therapy: Secondary | ICD-10-CM | POA: Diagnosis not present

## 2021-01-03 DIAGNOSIS — D472 Monoclonal gammopathy: Secondary | ICD-10-CM | POA: Insufficient documentation

## 2021-01-03 MED ORDER — SODIUM CHLORIDE 0.9 % IV SOLN
300.0000 mg | Freq: Once | INTRAVENOUS | Status: AC
  Administered 2021-01-03: 300 mg via INTRAVENOUS
  Filled 2021-01-03: qty 15

## 2021-01-03 MED ORDER — DIPHENHYDRAMINE HCL 50 MG/ML IJ SOLN
INTRAMUSCULAR | Status: AC
  Filled 2021-01-03: qty 1

## 2021-01-03 MED ORDER — DIPHENHYDRAMINE HCL 50 MG/ML IJ SOLN
25.0000 mg | Freq: Once | INTRAMUSCULAR | Status: AC
  Administered 2021-01-03: 25 mg via INTRAVENOUS

## 2021-01-03 MED ORDER — SODIUM CHLORIDE 0.9 % IV SOLN
Freq: Once | INTRAVENOUS | Status: AC
  Filled 2021-01-03: qty 250

## 2021-01-03 MED ORDER — ACETAMINOPHEN 325 MG PO TABS
650.0000 mg | ORAL_TABLET | Freq: Once | ORAL | Status: AC
  Administered 2021-01-03: 650 mg via ORAL

## 2021-01-03 MED ORDER — ACETAMINOPHEN 325 MG PO TABS
ORAL_TABLET | ORAL | Status: AC
  Filled 2021-01-03: qty 2

## 2021-01-03 NOTE — Patient Instructions (Signed)

## 2021-01-04 ENCOUNTER — Encounter (HOSPITAL_BASED_OUTPATIENT_CLINIC_OR_DEPARTMENT_OTHER): Payer: Self-pay | Admitting: Obstetrics & Gynecology

## 2021-01-04 ENCOUNTER — Ambulatory Visit (INDEPENDENT_AMBULATORY_CARE_PROVIDER_SITE_OTHER): Payer: BC Managed Care – PPO | Admitting: Obstetrics & Gynecology

## 2021-01-04 ENCOUNTER — Ambulatory Visit (HOSPITAL_BASED_OUTPATIENT_CLINIC_OR_DEPARTMENT_OTHER): Payer: BC Managed Care – PPO | Admitting: Obstetrics & Gynecology

## 2021-01-04 ENCOUNTER — Other Ambulatory Visit (HOSPITAL_COMMUNITY)
Admission: RE | Admit: 2021-01-04 | Discharge: 2021-01-04 | Disposition: A | Discharge status: Home / Self Care | Payer: BC Managed Care – PPO | Source: Ambulatory Visit | Attending: Obstetrics & Gynecology | Admitting: Obstetrics & Gynecology

## 2021-01-04 VITALS — BP 180/110 | HR 76 | Ht 67.75 in | Wt 181.0 lb

## 2021-01-04 DIAGNOSIS — D252 Subserosal leiomyoma of uterus: Secondary | ICD-10-CM

## 2021-01-04 DIAGNOSIS — Z124 Encounter for screening for malignant neoplasm of cervix: Secondary | ICD-10-CM | POA: Diagnosis not present

## 2021-01-04 DIAGNOSIS — D500 Iron deficiency anemia secondary to blood loss (chronic): Secondary | ICD-10-CM

## 2021-01-04 DIAGNOSIS — E611 Iron deficiency: Secondary | ICD-10-CM

## 2021-01-04 DIAGNOSIS — D251 Intramural leiomyoma of uterus: Secondary | ICD-10-CM

## 2021-01-04 DIAGNOSIS — I10 Essential (primary) hypertension: Secondary | ICD-10-CM

## 2021-01-04 DIAGNOSIS — N920 Excessive and frequent menstruation with regular cycle: Secondary | ICD-10-CM

## 2021-01-04 MED ORDER — HYDROCHLOROTHIAZIDE 25 MG PO TABS: 25.0000 mg | ORAL_TABLET | Freq: Every day | ORAL | 1 refills | Status: DC

## 2021-01-04 MED ORDER — MEGESTROL ACETATE 40 MG PO TABS: 40.0000 mg | ORAL_TABLET | Freq: Every day | ORAL | 1 refills | Status: DC

## 2021-01-04 NOTE — Progress Notes (Signed)
45 y.o. G0P0000 Married Tanya Murray American female here for annual exam.  Hasn't had any gynecological care in several years.  Cannot recall when had last pap smear.  She does have regular cycles but flow lasts 5 days but really heavy for 3 days.  She uses overnight pads and at night uses 2 pads.  She does soak through pads as well.  She passes clots and cramps a lot.  She is having some mid cycle spotting that is new.   She was admitted in early March for marked anemia.  Received 3 units PRBCs and two iron infusions.  Still has a few more iron infusions that are scheduled.  Currently under care with heme/onc.  Has seen Cassandra Heilingoetter, PA-C.  Last Hb was 9.6 on 12/16/2020.  Surprisingly, she was not particularly symptomatic of her significant anemia.  States she does feel better but doesn't seem to have any change in energy.    She is aware, during that evaluation, that very large fibroid uterus was noted.    CT showed:  The uterus is markedly enlarged and contains multiple large, heterogeneous, partially calcified soft tissue masses. The largest masses measure approximately 14.8 cm by 9.6 cm x 16.2 cm, 12.7 cm x 15.6 cm x 13.7 cm and 13.3 cm x 12.5 cm x 9.5 cm. These extend superiorly into the mid to upper abdomen. Mass effect is seen on the urinary bladder.  A 2.2 cm x 1.5 cm right adnexal cyst is noted.  Pt admits she was advised several years ago of fibroids but doesn't recall they were this large.  She was started on Megace 40mg  daily and states her bleeding is much improved since that time.  States she will have surgery if needed but if bleeding is better, she does not feel this "has to be done".    Pap due.  Has never had a mammogram.  Also undergoing evaluation with hematology/oncology for elevated protein levels.  Reviewed hospital notes, lab work from hospitalization and afterwards, CT and ultrasound reports.   reports that she has never smoked. She has never used  smokeless tobacco. She reports current alcohol use. She reports that she does not use drugs.  History reviewed. No pertinent past medical history.  No past surgical history on file.  Current Outpatient Medications  Medication Sig Dispense Refill  . acetaminophen (TYLENOL) 325 MG tablet Take 2 tablets (650 mg total) by mouth every 6 (six) hours as needed for mild pain or fever (Fever >/= 101, prior to blood administration). 20 tablet 0  . FeFum-FePoly-FA-B Cmp-C-Biot (INTEGRA PLUS) CAPS Take 1 capsule by mouth every morning. 30 capsule 2  . megestrol (MEGACE) 40 MG tablet Take 1 tablet (40 mg total) by mouth daily. 30 tablet 0  . ondansetron (ZOFRAN) 4 MG tablet Take 1 tablet (4 mg total) by mouth every 6 (six) hours as needed for nausea. (Patient not taking: Reported on 01/04/2021) 20 tablet 0   No current facility-administered medications for this visit.    Family History  Adopted: Yes    Review of Systems  Constitutional: Negative.   Respiratory: Negative.   Cardiovascular: Negative.   Gastrointestinal: Negative.   Genitourinary: Negative.     Exam:   BP (!) 197/135   Pulse 76   Ht 5' 7.75" (1.721 m)   Wt 181 lb (82.1 kg)   BMI 27.72 kg/m   Height: 5' 7.75" (172.1 cm)  General appearance: alert, cooperative and appears stated age Head: Normocephalic, without  obvious abnormality, atraumatic Lungs: clear to auscultation bilaterally Breasts: normal appearance, no masses or tenderness Heart: regular rate and rhythm Abdomen: soft, non-tender; bowel sounds normal; grossly enlarged uterus with extension on right to new rib edge, left side of uterus is about 20-22 weeks size Extremities: extremities normal, atraumatic, no cyanosis or edema Skin: Skin color, texture, turgor normal. No rashes or lesions Lymph nodes: Cervical, supraclavicular, and axillary nodes normal. No abnormal inguinal nodes palpated Neurologic: Grossly normal  Pelvic: External genitalia:  no lesions               Urethra:  normal appearing urethra with no masses, tenderness or lesions              Bartholins and Skenes: normal                 Vagina: normal appearing vagina with normal color and discharge, no lesions              Cervix: no lesions              Pap taken: Yes.   Bimanual Exam:  Uterus:  normal size, contour, position, consistency, mobility, non-tender              Adnexa: cannot distinguish from uterus  Chaperone, Prince Rome, CMA, was present for exam.  Assessment/Plan: 1. Intramural and subserous leiomyoma of uterus - d/w pt massive size of uterus.  Hysterectomy at this time would require a sizeable midline incision as I, personally, could not accomplish her surgery laparoscopically.  Will reach out to minimally invasive surgeons at [facility] to see if that is possible - Ambulatory referral to Interventional Radiology, Dr. Kathlene Cote, for his opinion regarding uterine artery embolization.  If this helps bleeding and even decreases size some, this would be a benefit before proceeding to surgery.  2. Iron deficiency anemia due to chronic blood loss - has follow up blood work planned with hematology in about two more months  3. Iron deficiency - having iron infusions  4. Cervical cancer screening - Cytology - PAP( [place])  5. Hypertension, unspecified type - pt needs follow up with PCP in 2 weeks for recheck. Pt denied any neurologic symptoms today.  Reasons for going back to ER discussed. - hydrochlorothiazide (HYDRODIURIL) 25 MG tablet; Take 1 tablet (25 mg total) by mouth daily.  Dispense: 30 tablet; Refill: 1  6. Menorrhagia with regular cycle - megestrol (MEGACE) 40 MG tablet; Take 1 tablet (40 mg total) by mouth daily.  Dispense: 90 tablet; Refill: 1 - will continue as this has helped bleeding.

## 2021-01-05 NOTE — Progress Notes (Signed)
[place] OFFICE PROGRESS NOTE  Tanya Morning, DO [address] [place] [address]  DIAGNOSIS:  1)  iron deficiency anemia due to menorrhagia from large fibroids 2) MGUS    PRIOR THERAPY: None  CURRENT THERAPY: 1) Oral Iron supplements 2) PRN IV iron with venofer 300 mg. Last dose on 01/10/21.  INTERVAL HISTORY: Tanya Murray 45 y.o. female returns to the clinic today for a follow-up visit.  The patient was first referred to the clinic after she presented to the emergency room with severe anemia.  Her work-up found significant and large uterine fibroids.  She was found to have significant iron deficiency anemia and has received IV iron with Venofer. The patient tolerated her IV iron well without any concerning adverse side effects.  She is also compliant with her oral iron supplement.  She is scheduled for her last iron infusion today. She is not sure if she has had any improvement in her energy at this time. She believes there may be a slight improvement. The patient recently followed up with her OB/GYN on 01/04/2021 due to her heavy menstrual bleeding, cramping, and passing of clots. The patient states they may refer her to [facility] for possible consideration of minimally invasive surgeons for hysterectomy. She was also referred to IR for consideration of uterine artery embolization to see if this helps bleeding and possibly decrease the size of the fibroids before surgery.   Part of the patient's routine work-up for anemia included an SPEP with immunofixation which showed an M spike of 0.4.  The patient subsequently had a myeloma panel performed including a CBC, CMP, LDH, beta-2 microglobulin, kappa/lambda light chains, and quantitative immunoglobulins.  She also had a 24-hour urine.  The patient is here today for evaluation and to review the results of these lab studies.   MEDICAL HISTORY: Past Medical History:  Diagnosis Date  . Adopted     ALLERGIES:  has  No Known Allergies.  MEDICATIONS:  Current Outpatient Medications  Medication Sig Dispense Refill  . acetaminophen (TYLENOL) 325 MG tablet Take 2 tablets (650 mg total) by mouth every 6 (six) hours as needed for mild pain or fever (Fever >/= 101, prior to blood administration). 20 tablet 0  . FeFum-FePoly-FA-B Cmp-C-Biot (INTEGRA PLUS) CAPS Take 1 capsule by mouth every Murray. 30 capsule 2  . hydrochlorothiazide (HYDRODIURIL) 25 MG tablet Take 1 tablet (25 mg total) by mouth daily. 30 tablet 1  . megestrol (MEGACE) 40 MG tablet Take 1 tablet (40 mg total) by mouth daily. 90 tablet 1  . ondansetron (ZOFRAN) 4 MG tablet Take 1 tablet (4 mg total) by mouth every 6 (six) hours as needed for nausea. (Patient not taking: Reported on 01/10/2021) 20 tablet 0   No current facility-administered medications for this visit.    SURGICAL HISTORY: No past surgical history on file.  REVIEW OF SYSTEMS:   Review of Systems  Constitutional: Positive for chronic fatigue. Negative for appetite change, chills, fever and unexpected weight change.  HENT: Negative for mouth sores, nosebleeds, sore throat and trouble swallowing.   Eyes: Negative for eye problems and icterus.  Respiratory: Negative for cough, hemoptysis, shortness of breath and wheezing.   Cardiovascular: Negative for chest pain and leg swelling.  Gastrointestinal: Negative for abdominal pain, constipation, diarrhea, nausea and vomiting.  Genitourinary: Positive for menorrhagia. Negative for bladder incontinence, difficulty urinating, dysuria, frequency and hematuria.   Musculoskeletal: Negative for back pain, gait problem, neck pain and neck stiffness.  Skin: Negative for itching and rash.  Neurological: Negative for dizziness, extremity weakness, gait problem, headaches, light-headedness and seizures.  Hematological: Negative for adenopathy. Does not bruise/bleed easily.  Psychiatric/Behavioral: Negative for confusion, depression and sleep  disturbance. The patient is not nervous/anxious.   PHYSICAL EXAMINATION:  Blood pressure (!) 142/95, pulse 71, temperature 97.8 F (36.6 C), temperature source Tympanic, resp. rate 15, height 5' 7.75" (1.721 m), weight 178 lb 11.2 oz (81.1 kg), SpO2 100 %.  ECOG PERFORMANCE STATUS: 1 - Symptomatic but completely ambulatory  Physical Exam  Constitutional: Oriented to person, place, and time and well-developed, well-nourished, and in no distress.  HENT:  Head: Normocephalic and atraumatic.  Mouth/Throat: Oropharynx is clear and moist. No oropharyngeal exudate.  Eyes: Conjunctivae are normal. Right eye exhibits no discharge. Left eye exhibits no discharge. No scleral icterus.  Neck: Normal range of motion. Neck supple.  Cardiovascular: Normal rate, regular rhythm, normal heart sounds and intact distal pulses.   Pulmonary/Chest: Effort normal and breath sounds normal. No respiratory distress. No wheezes. No rales.  Abdominal: Firm abdomen due to fibroids. Bowel sounds are normal. There is no tenderness. Uterus palpable near umbilicus.  Musculoskeletal: Normal range of motion. Exhibits no edema.  Lymphadenopathy:    No cervical adenopathy.  Neurological: Alert and oriented to person, place, and time. Exhibits normal muscle tone. Gait normal. Coordination normal.  Skin: Skin is warm and dry. No rash noted. Not diaphoretic. No erythema. No pallor.  Psychiatric: Mood, memory and judgment normal.  Vitals reviewed.  LABORATORY DATA: Lab Results  Component Value Date   WBC 5.1 12/16/2020   HGB 9.6 (L) 12/16/2020   HCT 35.1 (L) 12/16/2020   MCV 71.2 (L) 12/16/2020   PLT 167 12/16/2020      Chemistry      Component Value Date/Time   NA 139 12/16/2020 0830   K 4.0 12/16/2020 0830   CL 109 12/16/2020 0830   CO2 23 12/16/2020 0830   BUN 9 12/16/2020 0830   CREATININE 0.80 12/16/2020 0830      Component Value Date/Time   CALCIUM 9.4 12/16/2020 0830   ALKPHOS 56 12/16/2020 0830   AST  37 12/16/2020 0830   ALT 38 12/16/2020 0830   BILITOT 1.0 12/16/2020 0830       RADIOGRAPHIC STUDIES:  No results found.   ASSESSMENT/PLAN:  This is a very pleasant 45 year old African-American female referred to the clinic for iron deficiency anemia due to menorrhagia from large fibroids.  The patient was also found to have MGUS.   The patient is currently being treated with iron supplements and IV iron as needed with Venofer 300 mg.  She completed her last iron infusion earlier today.   Part of the patient's routine work-up showed an M spike of 0.4 on her SPEP with immunofixation and a IgG monoclonal protein.  The patient subsequently had further lab studies performed to rule out multiple myeloma including a beta-2 microglobulin, quantitative immunoglobulins, kappa/lambda light change.  She also had 24-hour urine.   The patient was seen with Dr. Julien Nordmann today.  Dr. Julien Nordmann reviewed the results of her lab studies which showed no M spike in her urine. Her beta 2 microglobulin was norma. Her kappa/lambda light chain ratio was slightly elevated at 1.68 and her kappa free light chain was elevated at 38.  Of note, the patient's CMP has been unremarkable with normal kidney function, no electrolyte derangements, and a normal total protein.  Dr. Julien Nordmann discussed that this is likely MGUS which  has a slight risk of 1% per year for converting to multiple myeloma. Dr. Julien Nordmann recommends that we monitor this closely every 6 months or so with a repeat myeloma panel. If her myeloma panel worsens in the future and becomes suspicious for multiple myeloma, then we would arrange for further workup at that time with a bone marrow biopsy and aspirate.   We will see her back for follow-up visit in 1 month for evaluation and repeat iron studies.  She will continue taking her oral iron supplement as prescribed.  She will continue to follow with OB/GYN regarding her menorrhagia/uterine fibroids.   The patient  was advised to call immediately if she has any concerning symptoms in the interval. The patient voices understanding of current disease status and treatment options and is in agreement with the current care plan. All questions were answered. The patient knows to call the clinic with any problems, questions or concerns. We can certainly see the patient much sooner if necessary      Orders Placed This Encounter  Procedures  . Beta 2 microglobulin    Standing Status:   Future    Standing Expiration Date:   01/10/2022  . QIG  (Quant. immunoglobulins  - IgG, IgA, IgM)    Standing Status:   Future    Standing Expiration Date:   01/10/2022  . Kappa/lambda light chains    Standing Status:   Future    Standing Expiration Date:   01/10/2022  . CBC with Differential (Cancer Center Only)    Standing Status:   Future    Standing Expiration Date:   01/10/2022  . CMP ([place] only)    Standing Status:   Future    Standing Expiration Date:   01/10/2022  . Lactate dehydrogenase (LDH)    Standing Status:   Future    Standing Expiration Date:   01/10/2022      Tobe Sos Tanya Swanner, PA-C 01/10/21  ADDENDUM: Hematology/Oncology Attending: I had a face-to-face encounter with the patient today.  I reviewed her labs and recommended her care plan.  This is a very pleasant 45 years old African-American female with history of iron deficiency anemia and currently undergoing iron infusion with Venofer.  During her anemia evaluation she had serum protein electrophoresis with immunofixation that showed monoclonal gammopathy. The patient had for myeloma panel performed recently and she is here for evaluation and discussion of her lab results.  She started feeling a little bit better after the iron infusion. Her myeloma panel showed mild increase in the IgG 1739 in addition to elevated free kappa light chain of 38.0.  Urine protein electrophoresis showed no M spike. I recommended for the patient to  continue with the iron infusion for now. Regarding the MGUS, we will see her back for follow-up visit in 6 months for evaluation with repeat myeloma panel and if she continues to have any further increase in her protein study, may consider her for follow work-up with bone marrow biopsy and aspirate as well as skeletal bone survey. The patient was advised to call immediately if she has any other concerning symptoms in the interval.  Disclaimer: This note was dictated with voice recognition software. Similar sounding words can inadvertently be transcribed and may be missed upon review. Eilleen Kempf, MD 01/10/21

## 2021-01-06 DIAGNOSIS — I10 Essential (primary) hypertension: Secondary | ICD-10-CM | POA: Insufficient documentation

## 2021-01-06 DIAGNOSIS — D251 Intramural leiomyoma of uterus: Secondary | ICD-10-CM | POA: Insufficient documentation

## 2021-01-06 DIAGNOSIS — D509 Iron deficiency anemia, unspecified: Secondary | ICD-10-CM | POA: Insufficient documentation

## 2021-01-06 LAB — CYTOLOGY - PAP
Comment: NEGATIVE
Diagnosis: NEGATIVE
High risk HPV: NEGATIVE

## 2021-01-10 ENCOUNTER — Inpatient Hospital Stay: Payer: BC Managed Care – PPO | Admitting: Physician Assistant

## 2021-01-10 ENCOUNTER — Telehealth (HOSPITAL_BASED_OUTPATIENT_CLINIC_OR_DEPARTMENT_OTHER): Payer: Self-pay | Admitting: Obstetrics & Gynecology

## 2021-01-10 ENCOUNTER — Inpatient Hospital Stay: Payer: BC Managed Care – PPO

## 2021-01-10 ENCOUNTER — Other Ambulatory Visit (HOSPITAL_BASED_OUTPATIENT_CLINIC_OR_DEPARTMENT_OTHER): Payer: Self-pay | Admitting: Obstetrics & Gynecology

## 2021-01-10 ENCOUNTER — Other Ambulatory Visit: Payer: Self-pay

## 2021-01-10 ENCOUNTER — Encounter: Payer: Self-pay | Admitting: Physician Assistant

## 2021-01-10 ENCOUNTER — Encounter (HOSPITAL_BASED_OUTPATIENT_CLINIC_OR_DEPARTMENT_OTHER): Payer: Self-pay

## 2021-01-10 VITALS — BP 126/85 | HR 82 | Temp 98.2°F | Resp 16

## 2021-01-10 VITALS — BP 142/95 | HR 71 | Temp 97.8°F | Resp 15 | Ht 67.75 in | Wt 178.7 lb

## 2021-01-10 DIAGNOSIS — E611 Iron deficiency: Secondary | ICD-10-CM

## 2021-01-10 DIAGNOSIS — D500 Iron deficiency anemia secondary to blood loss (chronic): Secondary | ICD-10-CM | POA: Diagnosis not present

## 2021-01-10 DIAGNOSIS — D472 Monoclonal gammopathy: Secondary | ICD-10-CM | POA: Diagnosis not present

## 2021-01-10 MED ORDER — ACETAMINOPHEN 325 MG PO TABS
650.0000 mg | ORAL_TABLET | Freq: Once | ORAL | Status: AC
Start: 2021-01-10 — End: 2021-01-10
  Administered 2021-01-10: 650 mg via ORAL

## 2021-01-10 MED ORDER — DIPHENHYDRAMINE HCL 50 MG/ML IJ SOLN
25.0000 mg | Freq: Once | INTRAMUSCULAR | Status: AC
Start: 2021-01-10 — End: 2021-01-10
  Administered 2021-01-10: 25 mg via INTRAVENOUS

## 2021-01-10 MED ORDER — SODIUM CHLORIDE 0.9 % IV SOLN
Freq: Once | INTRAVENOUS | Status: AC
  Filled 2021-01-10: qty 250

## 2021-01-10 MED ORDER — SODIUM CHLORIDE 0.9 % IV SOLN
300.0000 mg | Freq: Once | INTRAVENOUS | Status: AC
  Administered 2021-01-10: 300 mg via INTRAVENOUS
  Filled 2021-01-10: qty 10

## 2021-01-10 MED ORDER — METRONIDAZOLE 500 MG PO TABS: 500.0000 mg | ORAL_TABLET | ORAL | 0 refills | Status: DC

## 2021-01-10 MED ORDER — DIPHENHYDRAMINE HCL 50 MG/ML IJ SOLN
INTRAMUSCULAR | Status: AC
  Filled 2021-01-10: qty 1

## 2021-01-10 MED ORDER — ACETAMINOPHEN 325 MG PO TABS
ORAL_TABLET | ORAL | Status: AC
  Filled 2021-01-10: qty 2

## 2021-01-10 NOTE — Patient Instructions (Signed)

## 2021-01-10 NOTE — Telephone Encounter (Signed)
Called and left a m essage to please call the office to scheduled 1 month follow up appointment with [place] .

## 2021-01-10 NOTE — Progress Notes (Signed)
Flagyl 2 gram orally x 1 sent to pharmacy.

## 2021-01-10 NOTE — Patient Instructions (Signed)
Monoclonal Gammopathy of Undetermined Significance Monoclonal gammopathy of undetermined significance (MGUS) is a condition in which there is too much of a protein called monoclonal protein, or M protein, in the blood. MGUS can cause you to have too many cells in your blood and not enough space for healthy cells. This condition does not cause symptoms, but it may increase your risk of developing multiple myeloma or other blood disorders in the future. What are the causes? The cause of this condition is not known. Genetics and the environment may play a role. What increases the risk? You are more likely to develop this condition if:  You are African American.  You are age 50 or older.  You are female.  You have an autoimmune disease.  You have been exposed to radiation.  You have a family history of MGUS. What are the signs or symptoms? There are no symptoms of this condition. How is this diagnosed? This condition may be diagnosed with a blood test that checks for M protein.   How is this treated? Treatment may involve monitoring your condition. This may include:  Having regular exams. This will allow your health care provider to monitor your health.  Having tests done regularly, such as: ? Blood tests to check for M protein in your body. ? Imaging tests, such as a CT scan. ? A bone marrow biopsy. This test involves taking a sample of bone marrow from your body so it can be looked at under a microscope. Follow these instructions at home:  Keep all follow-up visits as told by your health care provider. This is important. Contact a health care provider if:  You have trouble swallowing.  You have pain in your back or ribs.  You have a fever.  You are bruising easily. Get help right away if:  You break a bone.  You have trouble breathing. Summary  Monoclonal gammopathy of undetermined significance (MGUS) is a condition in which there is too much of a protein called  monoclonal protein, or M protein, in the blood.  This condition may be diagnosed with a blood test that checks for M protein.  Treatment for this condition may involve having tests done regularly. Tests may include blood tests, imaging tests, and a bone marrow biopsy. This information is not intended to replace advice given to you by your health care provider. Make sure you discuss any questions you have with your health care provider. Document Revised: 08/07/2019 Document Reviewed: 08/07/2019 Elsevier Patient Education  2021 Elsevier Inc.  

## 2021-01-11 ENCOUNTER — Telehealth: Payer: Self-pay | Admitting: Physician Assistant

## 2021-01-11 NOTE — Telephone Encounter (Signed)
Scheduled per los. Called and left msg. Mailed printout  °

## 2021-01-12 ENCOUNTER — Encounter (HOSPITAL_BASED_OUTPATIENT_CLINIC_OR_DEPARTMENT_OTHER): Payer: Self-pay

## 2021-01-13 ENCOUNTER — Other Ambulatory Visit (HOSPITAL_COMMUNITY)
Admission: RE | Admit: 2021-01-13 | Discharge: 2021-01-13 | Disposition: A | Discharge status: Home / Self Care | Payer: BC Managed Care – PPO | Source: Ambulatory Visit | Attending: Obstetrics & Gynecology | Admitting: Obstetrics & Gynecology

## 2021-01-13 ENCOUNTER — Ambulatory Visit (INDEPENDENT_AMBULATORY_CARE_PROVIDER_SITE_OTHER): Payer: BC Managed Care – PPO | Admitting: Obstetrics & Gynecology

## 2021-01-13 ENCOUNTER — Other Ambulatory Visit: Payer: Self-pay | Admitting: Physician Assistant

## 2021-01-13 ENCOUNTER — Other Ambulatory Visit: Payer: Self-pay

## 2021-01-13 ENCOUNTER — Other Ambulatory Visit (HOSPITAL_BASED_OUTPATIENT_CLINIC_OR_DEPARTMENT_OTHER)
Admission: RE | Admit: 2021-01-13 | Discharge: 2021-01-13 | Disposition: A | Discharge status: Home / Self Care | Payer: BC Managed Care – PPO | Source: Ambulatory Visit | Attending: Obstetrics & Gynecology | Admitting: Obstetrics & Gynecology

## 2021-01-13 VITALS — BP 141/100 | HR 113 | Wt 169.0 lb

## 2021-01-13 DIAGNOSIS — Z8619 Personal history of other infectious and parasitic diseases: Secondary | ICD-10-CM | POA: Diagnosis not present

## 2021-01-13 DIAGNOSIS — Z113 Encounter for screening for infections with a predominantly sexual mode of transmission: Secondary | ICD-10-CM | POA: Insufficient documentation

## 2021-01-13 DIAGNOSIS — D259 Leiomyoma of uterus, unspecified: Secondary | ICD-10-CM

## 2021-01-13 DIAGNOSIS — D472 Monoclonal gammopathy: Secondary | ICD-10-CM

## 2021-01-13 DIAGNOSIS — Z831 Family history of other infectious and parasitic diseases: Secondary | ICD-10-CM

## 2021-01-13 DIAGNOSIS — D500 Iron deficiency anemia secondary to blood loss (chronic): Secondary | ICD-10-CM

## 2021-01-13 DIAGNOSIS — E611 Iron deficiency: Secondary | ICD-10-CM

## 2021-01-13 LAB — HIV ANTIBODY (ROUTINE TESTING W REFLEX): HIV Screen 4th Generation wRfx: NONREACTIVE

## 2021-01-13 LAB — HEPATITIS B SURFACE ANTIGEN: Hepatitis B Surface Ag: NONREACTIVE

## 2021-01-13 LAB — HEPATITIS C ANTIBODY: HCV Ab: NONREACTIVE

## 2021-01-13 MED ORDER — TRANEXAMIC ACID 650 MG PO TABS: 1300.0000 mg | ORAL_TABLET | Freq: Three times a day (TID) | ORAL | 1 refills | Status: DC

## 2021-01-13 MED ORDER — HYDROCODONE-ACETAMINOPHEN 5-325 MG PO TABS: 1.0000 | ORAL_TABLET | Freq: Four times a day (QID) | ORAL | 0 refills | Status: DC | PRN

## 2021-01-14 LAB — CERVICOVAGINAL ANCILLARY ONLY
Chlamydia: NEGATIVE
Comment: NEGATIVE
Comment: NORMAL
Neisseria Gonorrhea: NEGATIVE

## 2021-01-14 LAB — RPR: RPR Ser Ql: NONREACTIVE

## 2021-01-15 ENCOUNTER — Encounter (HOSPITAL_BASED_OUTPATIENT_CLINIC_OR_DEPARTMENT_OTHER): Payer: Self-pay | Admitting: Obstetrics & Gynecology

## 2021-01-15 NOTE — Progress Notes (Signed)
GYNECOLOGY  VISIT  CC:   Abdominal discomfort, mass concerning to pt  HPI: 45 y.o. [place] Married [place] American female here for complaint of increased abdominal discomfort as well as a "buldge" that pt has noted in [place] and is concerning to pt.  She was diagnosed with trichomonas on pap last week.  She's been married to female for 4 years and has not had any STD testing during that time.  Bleeding does have odor at times as well.  She does have hx of prior relationship with female.  She was surprised by this diagnosis and has taken the metronidazole.  It is clear after changes in pt's symptoms in the last 10 days, she needs to proceed with definitive treatment.  We need to proceed with additional evaluation as well as IR [place] prior to hysterectomy.  May need additional help in the OR due to size of uterus.  Will reach out to gyn/oncology for input as well as do have some concerns about size of uterus.  GYNECOLOGIC HISTORY: Contraception: same sex marriage  Patient Active Problem List   Diagnosis Date Noted  . 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 Iron deficiency anemia 01/06/2021  . Hypertension 01/06/2021  . Intramural and subserous leiomyoma of uterus 01/06/2021  . Iron deficiency 12/09/2020  . Pancytopenia ([place]) 12/09/2020    Past Medical History:  Diagnosis Date  . Adopted     No past surgical history on file.  MEDS:   Current Outpatient Medications on File Prior to Visit  Medication Sig Dispense Refill  . FeFum-FePoly-FA-B Cmp-C-Biot (INTEGRA PLUS) CAPS Take 1 capsule by mouth every morning. 30 capsule 2  . hydrochlorothiazide (HYDRODIURIL) 25 MG tablet Take 1 tablet (25 mg total) by mouth daily. 30 tablet 1  . megestrol (MEGACE) 40 MG tablet Take 1 tablet (40 mg total) by mouth daily. 90 tablet 1  . metroNIDAZOLE (FLAGYL) 500 MG tablet Take 1 tablet (500 mg total) by mouth 1 day or 1 dose. Take all 4 tabs (2 grams) together at the same time  4 tablet 0  . ondansetron (ZOFRAN) 4 MG tablet Take 1 tablet (4 mg total) by mouth every 6 (six) hours as needed for nausea. 20 tablet 0   No current facility-administered medications on file prior to visit.    ALLERGIES: Patient has no known allergies.  Family History  Adopted: Yes    SH:  Married to wife, non smoker  Review of Systems  Respiratory: Negative.   Cardiovascular: Negative.   Gastrointestinal: Positive for abdominal distention (and discomfort). Negative for constipation and diarrhea.  Genitourinary: Positive for menstrual problem.    PHYSICAL EXAMINATION:    BP (!) 141/100   Pulse (!) 113   Wt 169 lb (76.7 kg)   BMI 25.89 kg/m     General appearance: alert, cooperative and appears stated age CV:  Regular rate and rhythm Lungs:  clear to auscultation, no wheezes, rales or rhonchi, symmetric air entry Abdomen: soft, non-tender; bowel sounds normal; significantly enlarged uterus with findings up to ribs on right side Lymph:  no inguinal LAD noted  Pelvic: pt did self swab  Chaperone, Prince Rome, CMA, was present for exam.  Assessment/Plan: 1. Uterine leiomyoma, unspecified location - pt need MRI of abdomen and pelvis.  Will need to work on getting this precertified and scheduled early next week - due to size and change in discomfort for pt, will need to proceed with scheduling surgery.  Will see if  can coordinate with interventional radiology to do a [place] a couple of days prior to the surgery to decrease risks of significant blood loss during hysterectomy - due to discomfort, rx for HYDROcodone-acetaminophen (NORCO/VICODIN) 5-325 MG tablet; Take 1-2 tablets by mouth every 6 (six) hours as needed for moderate pain.  Dispense: 20 tablet; Refill: 0  2. History of trichomoniasis - once I know all STD testing is negative, will have pt return for endometrial biopsy.  3. Screening examination for STD (sexually transmitted disease) - RPR; Future - HIV Antibody  (routine testing w rflx); Future - Hepatitis B surface antigen; Future - Hepatitis C antibody; Future  4.  H/o significant anemia - rx for lysteda to pharmacy for when cycles actually occurs.  Pt will increase megace to 40mg  bid.  Has rx for now.

## 2021-01-18 ENCOUNTER — Other Ambulatory Visit (HOSPITAL_BASED_OUTPATIENT_CLINIC_OR_DEPARTMENT_OTHER): Payer: Self-pay | Admitting: Obstetrics & Gynecology

## 2021-01-18 DIAGNOSIS — D251 Intramural leiomyoma of uterus: Secondary | ICD-10-CM

## 2021-01-18 DIAGNOSIS — R1900 Intra-abdominal and pelvic swelling, mass and lump, unspecified site: Secondary | ICD-10-CM

## 2021-01-18 DIAGNOSIS — D500 Iron deficiency anemia secondary to blood loss (chronic): Secondary | ICD-10-CM

## 2021-01-19 ENCOUNTER — Telehealth: Payer: Self-pay | Admitting: *Deleted

## 2021-01-19 NOTE — Telephone Encounter (Signed)
Spoke with Interventional Radiology regarding referral for pt. They stated they would call pt and get an appt set up.  Informed pt of this and advised to let us know if she doesn't hear from them within a week. Pt verbalized understanding.

## 2021-01-19 NOTE — Telephone Encounter (Signed)
DOB verified. Informed pt that appt for MRI was made for 01/27/21 at 4 pm, which was the first available.  Advised pt to arrive at 3:30 and she would enter through the main entrance at [facility].  Advised pt to have nothing to eat or drink 4 hours prior to procedure.  Advised that I am still trying to speak with someone in interventional radiology to schedule her appt with them and I will call her back once that is made. Pt verbalized understanding.

## 2021-01-25 ENCOUNTER — Other Ambulatory Visit: Payer: Self-pay | Admitting: Obstetrics & Gynecology

## 2021-01-25 DIAGNOSIS — D259 Leiomyoma of uterus, unspecified: Secondary | ICD-10-CM

## 2021-01-26 ENCOUNTER — Encounter (HOSPITAL_BASED_OUTPATIENT_CLINIC_OR_DEPARTMENT_OTHER): Payer: Self-pay

## 2021-01-27 ENCOUNTER — Ambulatory Visit (HOSPITAL_COMMUNITY)
Admission: RE | Admit: 2021-01-27 | Discharge: 2021-01-27 | Disposition: A | Discharge status: Home / Self Care | Payer: BC Managed Care – PPO | Source: Ambulatory Visit | Attending: Obstetrics & Gynecology | Admitting: Obstetrics & Gynecology

## 2021-01-27 ENCOUNTER — Other Ambulatory Visit: Payer: Self-pay

## 2021-01-27 DIAGNOSIS — D251 Intramural leiomyoma of uterus: Secondary | ICD-10-CM | POA: Diagnosis present

## 2021-01-27 DIAGNOSIS — D252 Subserosal leiomyoma of uterus: Secondary | ICD-10-CM | POA: Diagnosis present

## 2021-01-27 DIAGNOSIS — R1900 Intra-abdominal and pelvic swelling, mass and lump, unspecified site: Secondary | ICD-10-CM

## 2021-01-27 IMAGING — MR MR ABDOMEN WO/W CM
9 of 12 series · 42 of 48 positions shown · IV contrast (gadavist)
Comparison: CT scan [DATE].

CLINICAL DATA: Uterine fibroid.  Abnormal uterine bleeding.

EXAM:
MRI ABDOMEN AND PELVIS WITHOUT AND WITH CONTRAST
TECHNIQUE: Multiplanar multisequence MR imaging of the abdomen and pelvis was
performed both before and after the administration of intravenous
contrast.
CONTRAST:  7.5mL GADAVIST GADOBUTROL 1 MMOL/ML IV SOLN

[Series 4: cor haste · coronal · 6.0mm · 1.19mm/px · 1 of 35 slices shown]
[im 1/35]
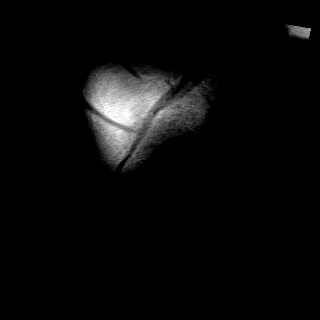

[Series 5: ax haste · axial · 6.0mm · 1.19mm/px · z∈[-18,+284]mm · 3 of 43 slices shown]
[im 1/43]
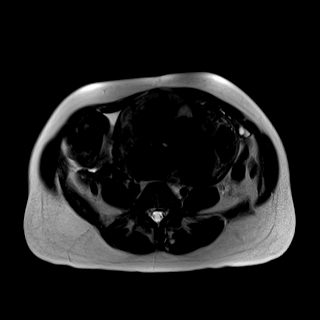
[im 22/43]
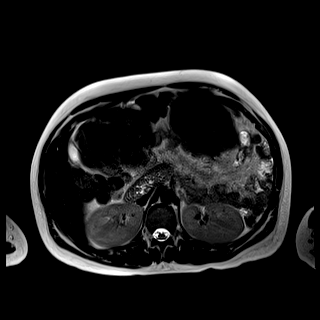
[im 43/43]
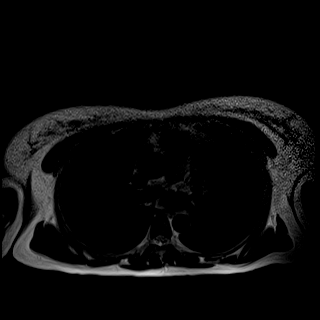

[Series 8: t1_vibe_opp-in_tra_p4_bh · axial · 3.0mm · 1.19mm/px · z∈[-3,+282]mm · 7 of 96 slices shown (1 of 2)]
[im 1/96]
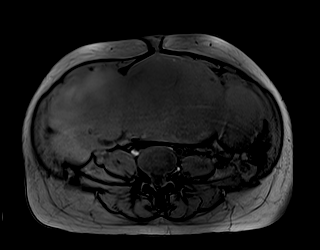
[im 16/96]
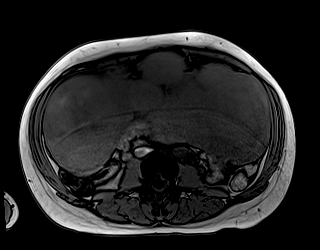
[im 32/96]
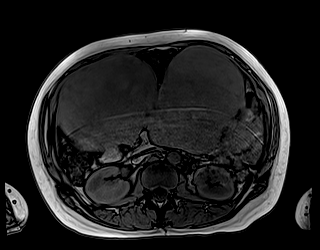
[im 48/96]
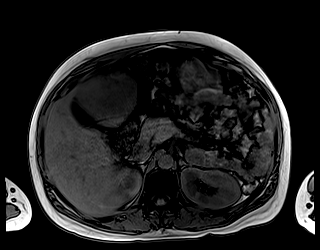
[im 64/96]
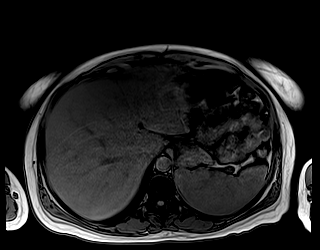
[im 80/96]
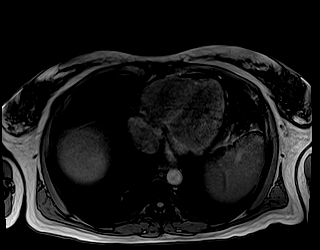
[im 96/96]
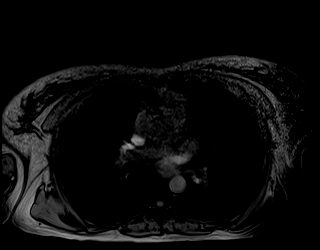

[Series 8: t1_vibe_opp-in_tra_p4_bh · axial · 3.0mm · 1.19mm/px · z∈[-3,+282]mm · 7 of 96 slices shown (2 of 2)]
[im 1/96]
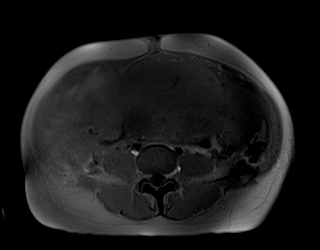
[im 16/96]
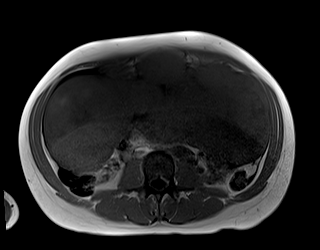
[im 32/96]
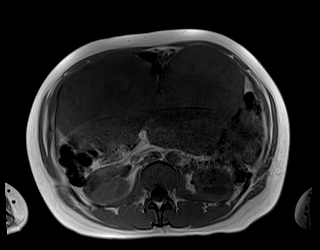
[im 48/96]
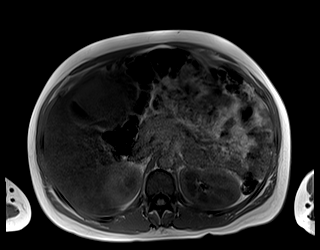
[im 64/96]
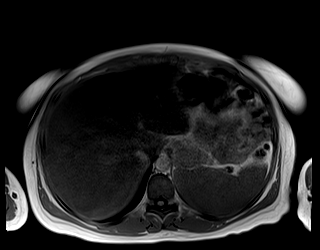
[im 80/96]
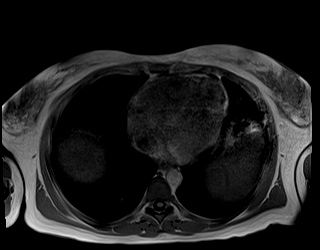
[im 96/96]
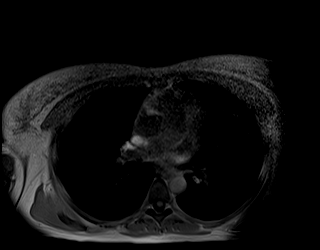

[Series 9: T2 fat-sat · axial · 6.0mm · 1.19mm/px · z∈[-18,+284]mm · 3 of 43 slices shown]
[im 1/43]
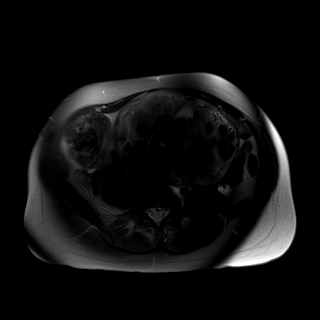
[im 22/43]
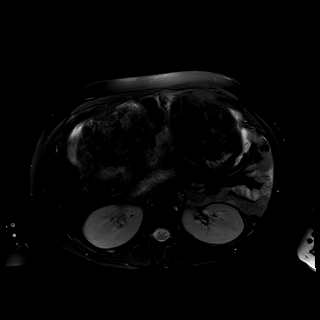
[im 43/43]
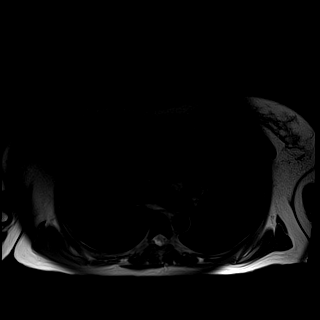

[Series 10: DWI · axial · 6.0mm · 1.42mm/px · z∈[-21,+281]mm · 9 of 129 slices shown (1 of 2)]
[im 1/129]
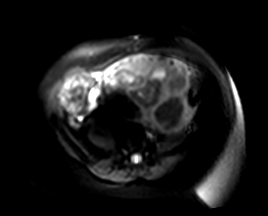
[im 17/129]
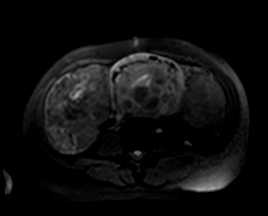
[im 33/129]
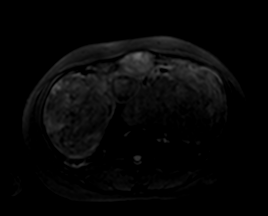
[im 49/129]
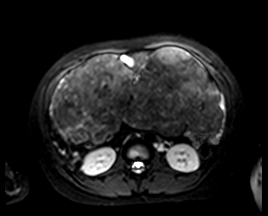
[im 65/129]
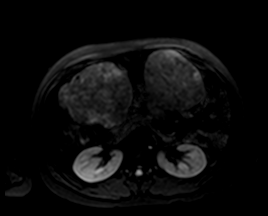
[im 81/129]
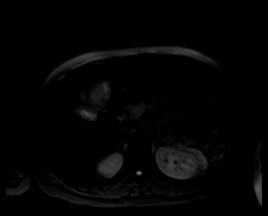
[im 97/129]
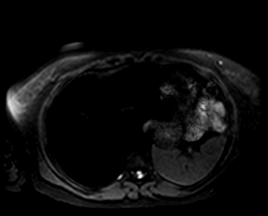
[im 113/129]
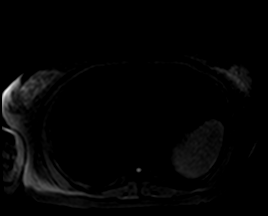
[im 129/129]
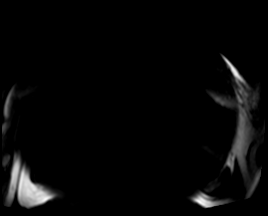

[Series 11: DWI · axial · 6.0mm · 1.42mm/px · z∈[-21,+281]mm · 3 of 43 slices shown (2 of 2)]
[im 1/43]
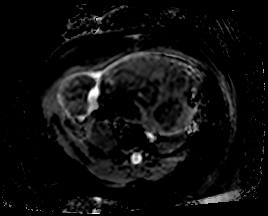
[im 22/43]
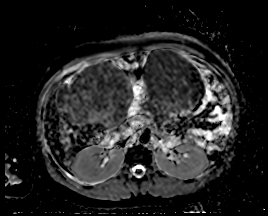
[im 43/43]
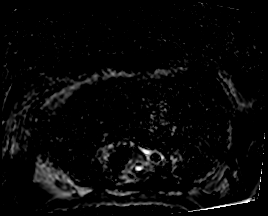

[Series 12: bSSFP · axial · 6.0mm · 0.74mm/px · z∈[-21,+281]mm · 3 of 43 slices shown]
[im 1/43]
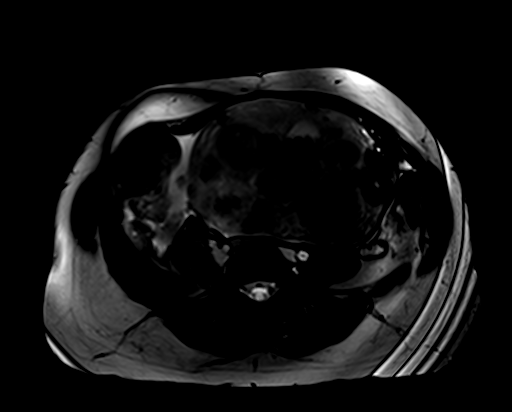
[im 22/43]
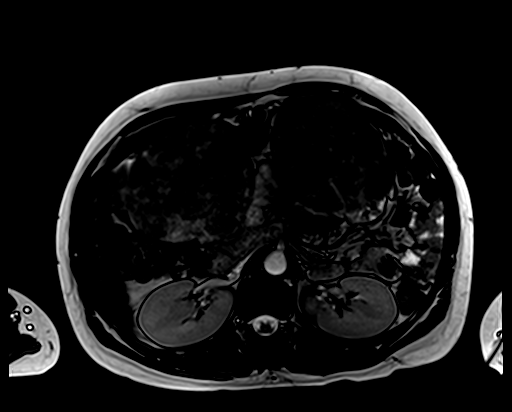
[im 43/43]
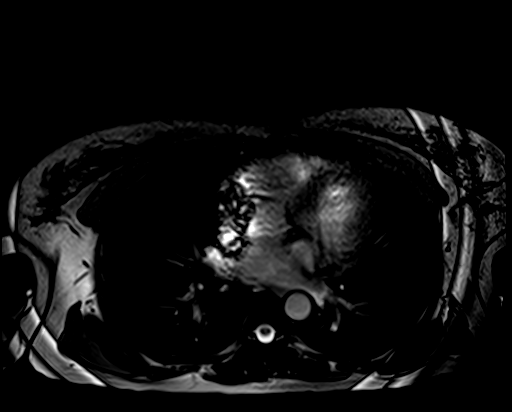

[Series 16: T1 dynamic post-contrast · coronal · 3.0mm · 1.31mm/px · 6 of 80 slices shown]
[im 1/80]
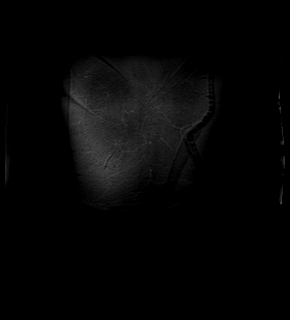
[im 16/80]
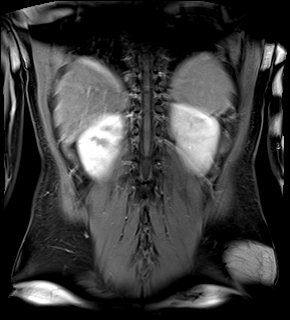
[im 32/80]
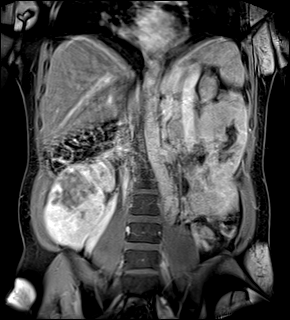
[im 48/80]
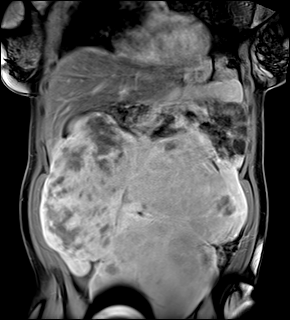
[im 64/80]
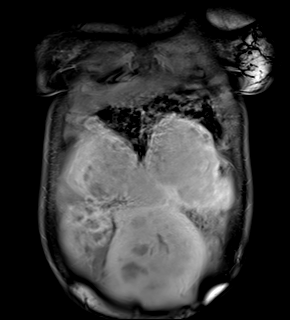
[im 80/80]
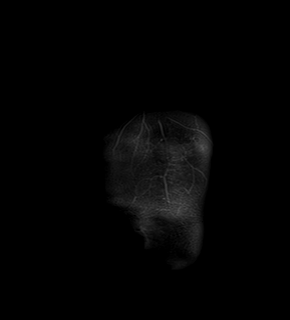

[42 of 48 positions shown; findings below may reference images not displayed]

FINDINGS: COMBINED FINDINGS FOR BOTH MR ABDOMEN AND PELVIS

Lower chest: Chest unremarkable.

Hepatobiliary: No suspicious focal abnormality within the liver
parenchyma. There is no evidence for gallstones, gallbladder wall
thickening, or pericholecystic fluid. No intrahepatic or
extrahepatic biliary dilation.

Pancreas: No focal mass lesion. No dilatation of the main duct. No
intraparenchymal cyst. No peripancreatic edema. Postcontrast imaging
of the pancreas is motion obscured.

Spleen:  No splenomegaly.

Adrenals/Urinary Tract: No adrenal nodule or mass. No suspicious
abnormality in either kidney although fine detail is obscured on
postcontrast imaging due to motion artifact. No hydroureter. Bladder
is nondistended.

Stomach/Bowel: Stomach unremarkable. No evidence for bowel
obstruction.

Vascular/Lymphatic: No abdominal aortic aneurysm no abdominal
lymphadenopathy. No pelvic lymphadenopathy.

Reproductive:

Uterus: The entire uterus could not be included in a single field of
view by MRI. There are multiple uterine fibroids with bulky
exophytic mass lesions from the uterine fundus bilaterally. In TUM,
measurements are approximately 14.7 x 33.2 x 10.7 cm. Estimated
volume = 2,731 cc. Multiple intramural, submucosal, and subserosal
fibroids of varying size and enhancement characteristics are evident
throughout the uterus. Dominant fibroid is in the anterior lower
uterine segment measuring 9.1 x 7.6 x 7.5 cm.

As noted on the CT scan, there are multiple large exophytic
components arising from the uterine fundus. Dominant left-sided
lesion measures approximately 16.1 x 12.6 x 13.8 cm. Stalk measuring
about 5.4 cm in diameter connected to this lesion is visible on
coronal image 31 of series 4.

A second right-sided exophytic fundal lesion measures 22.2 x 10.7 x
10.7 cm.

Multiple other smaller exophytic fundal fibroid lesions evident.

As in the uterus, these exophytic components are markedly
heterogeneous and nodular showing variable degrees of enhancement.

Right ovary: Right ovary is displaced high into the right,
positioned at the level of the right iliac crest. Ovary measures
approximately 2.9 x 3.1 x 1.2 cm and unremarkable in appearance.
Parenchyma well seen on axial T2 image 4 of series 7.

Left ovary: Left ovary is high and lateral, above the level of the
left iliac crest and compressed by the enlarged uterine process.
Left ovary measures approximately 2.8 x 2.8 x 1.1 cm and is normal
in appearance (see axial T2 image 8 of series 7).

Other:  Small volume free fluid in the pelvis.

Musculoskeletal: No focal suspicious marrow enhancement within the
visualized bony anatomy.
IMPRESSION: 1. Markedly enlarged uterus with multiple bulky exophytic lesions of
varying size and enhancement characteristics. These changes are
probably all related to benign fibroid disease and there are no
overt signs of malignancy such as adjacent organ invasion or
metastatic disease. However, leiomyosarcoma and fibroid degeneration
cannot be reliably discriminated on imaging.
2. Bilateral ovaries are normal in size and appearance.
3. Small volume free fluid in the pelvis.

## 2021-01-27 IMAGING — MR MR PELVIS WO/W CM
13 of 22 series · 27 of 48 positions shown · IV contrast (gadavist)
Comparison: CT scan [DATE].

CLINICAL DATA: Uterine fibroid.  Abnormal uterine bleeding.

EXAM:
MRI ABDOMEN AND PELVIS WITHOUT AND WITH CONTRAST
TECHNIQUE: Multiplanar multisequence MR imaging of the abdomen and pelvis was
performed both before and after the administration of intravenous
contrast.
CONTRAST:  7.5mL GADAVIST GADOBUTROL 1 MMOL/ML IV SOLN

[Series 4: T2 · coronal · 5.0mm · 1.66mm/px · 1 of 39 slices shown]
[im 1/39]
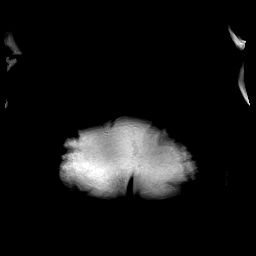

[Series 5: ax tse trig · axial · 5.0mm · 0.78mm/px · 1 of 37 slices shown (1 of 2)]
[im 1/37]
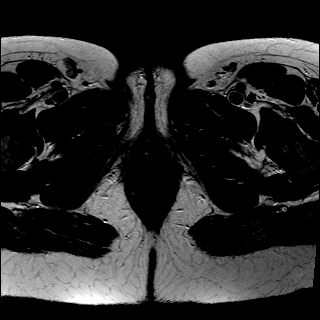

[Series 6: ax tse fs · axial · 5.0mm · 0.78mm/px · 1 of 41 slices shown]
[im 1/41]
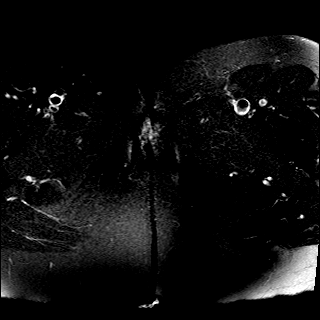

[Series 7: ax tse trig · axial · 5.0mm · 0.78mm/px · 1 of 41 slices shown (2 of 2)]
[im 1/41]
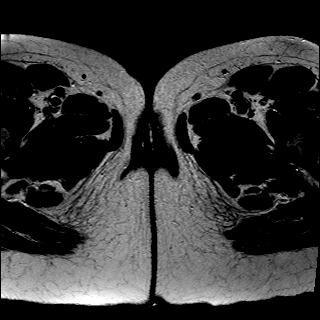

[Series 8: sag tse trig · sagittal · 5.0mm · 0.78mm/px · 1 of 37 slices shown]
[im 1/37]
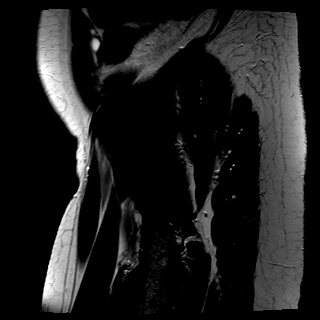

[Series 9: ax in and · axial · 4.0mm · 0.75mm/px · 1 of 72 slices shown (1 of 2)]
[im 1/72]
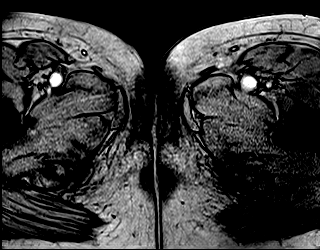

[Series 9: ax in and · axial · 4.0mm · 0.75mm/px · 1 of 72 slices shown (2 of 2)]
[im 1/72]
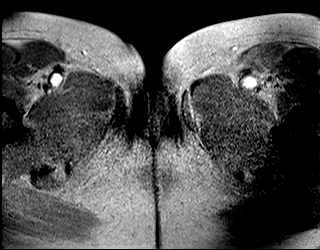

[Series 10: T1 dynamic fat-sat · axial · 1.4mm · 0.78mm/px · z∈[-374,-129]mm · 4 of 176 slices shown (1 of 2)]
[im 1/176]
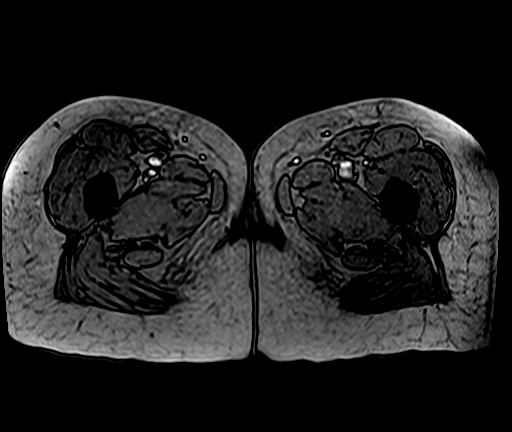
[im 59/176]
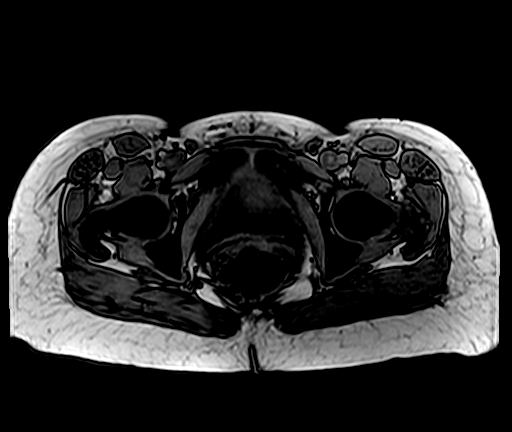
[im 117/176]
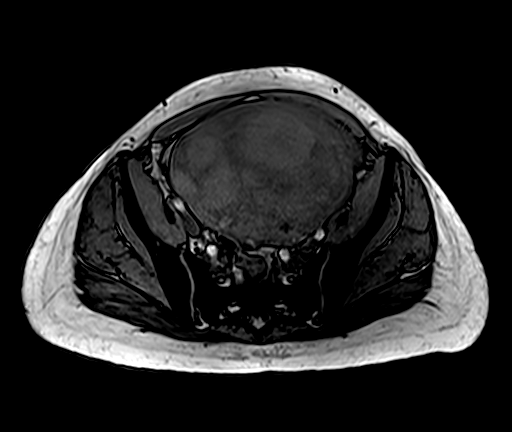
[im 176/176]
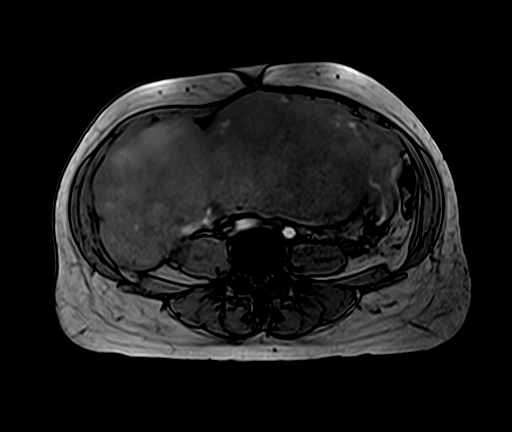

[Series 10: T1 dynamic fat-sat · axial · 1.4mm · 0.78mm/px · z∈[-374,-129]mm · 4 of 176 slices shown (2 of 2)]
[im 1/176]
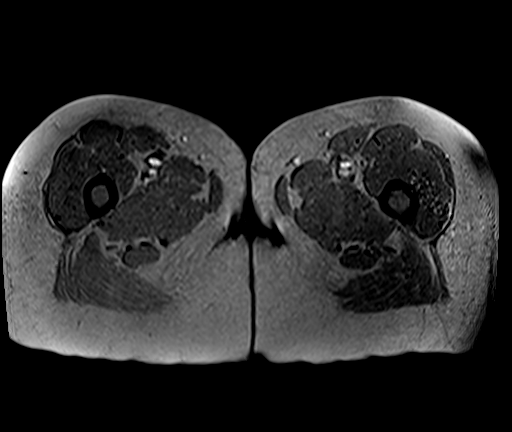
[im 59/176]
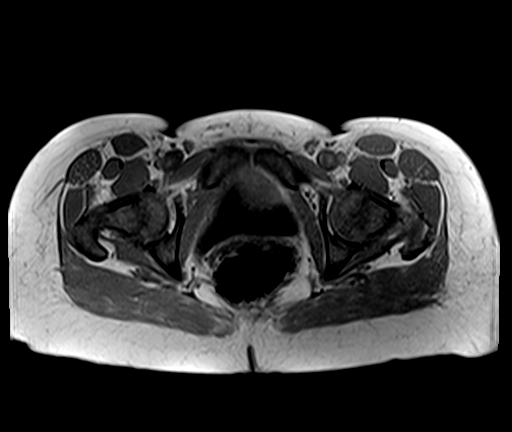
[im 117/176]
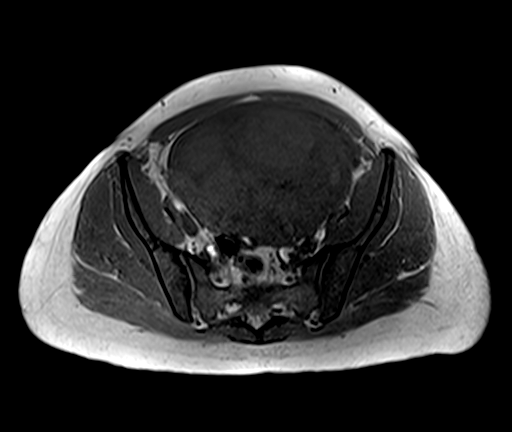
[im 176/176]
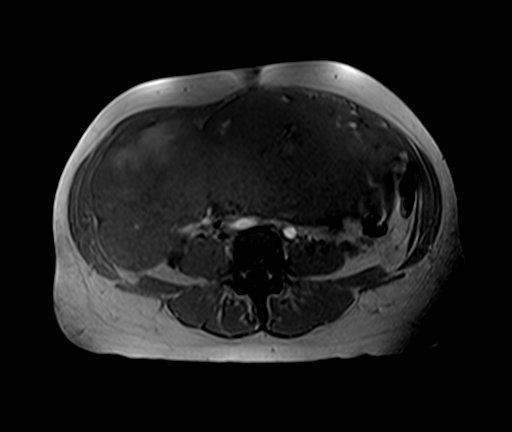

[Series 11: T1 dynamic · axial · 1.4mm · 0.78mm/px · z∈[-363,-140]mm · 4 of 160 slices shown (1 of 2)]
[im 1/160]
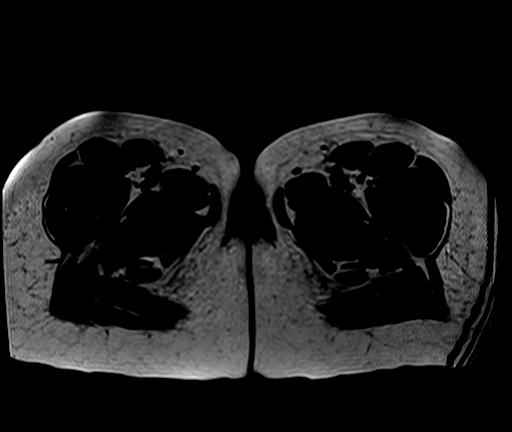
[im 54/160]
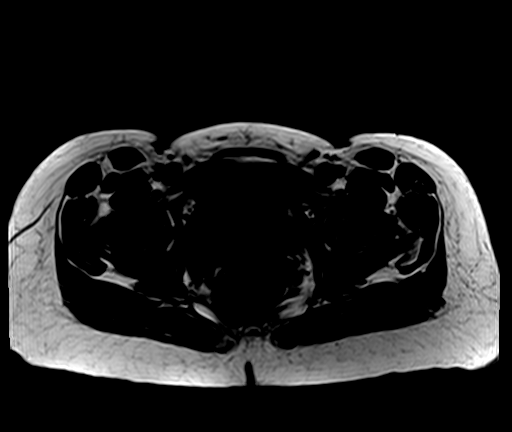
[im 107/160]
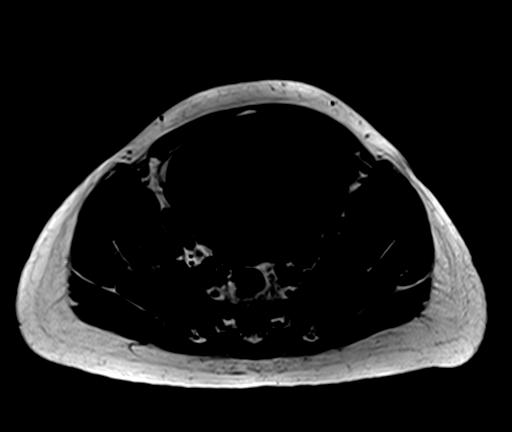
[im 160/160]
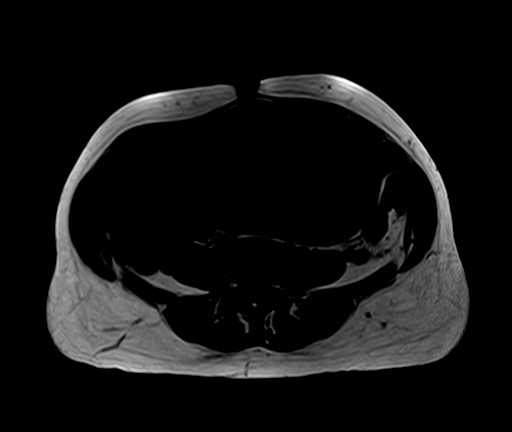

[Series 12: T1 dynamic · axial · 1.4mm · 0.78mm/px · z∈[-363,-140]mm · 4 of 160 slices shown (2 of 2)]
[im 1/160]
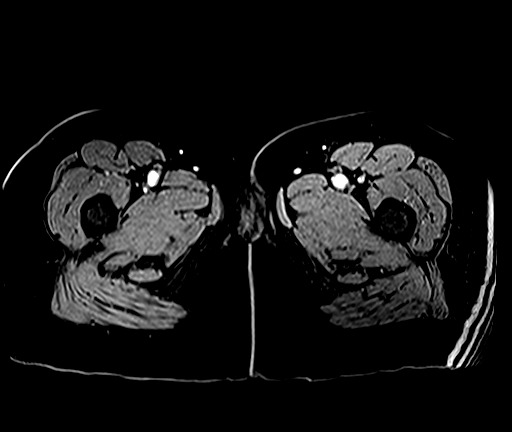
[im 54/160]
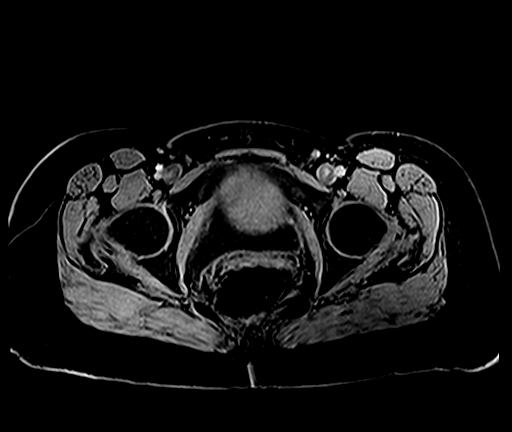
[im 107/160]
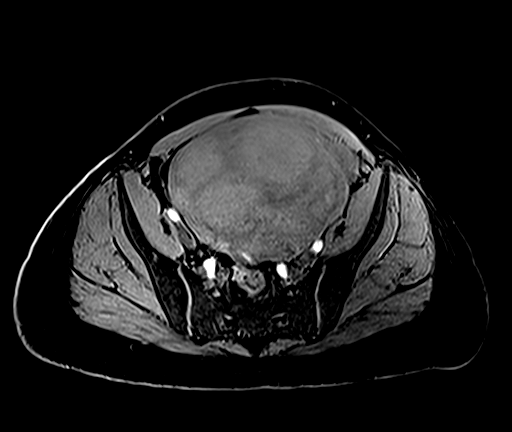
[im 160/160]
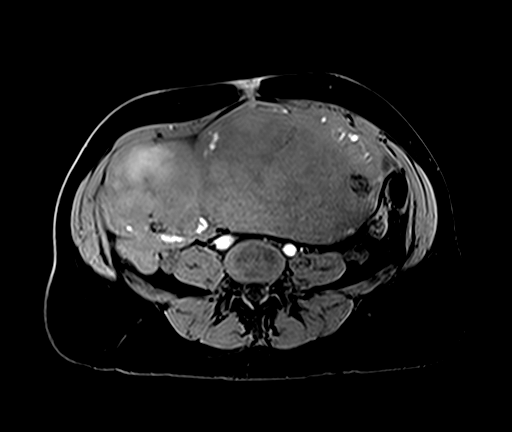

[Series 13: t1_vibe_fs_tra_p4_bh_pre · axial · 3.0mm · 0.88mm/px · z∈[-372,-111]mm · 2 of 88 slices shown]
[im 1/88]
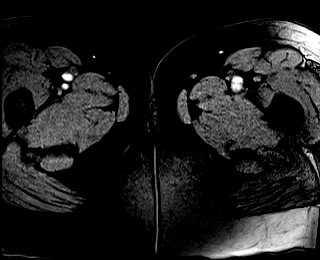
[im 88/88]
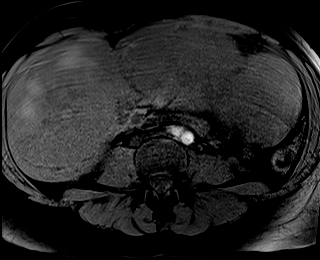

[Series 15: t1_vibe_fs_tra_p4_bh_post · axial · 3.0mm · 0.88mm/px · z∈[-372,-111]mm · 2 of 88 slices shown]
[im 1/88]
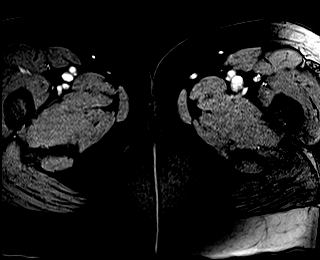
[im 88/88]
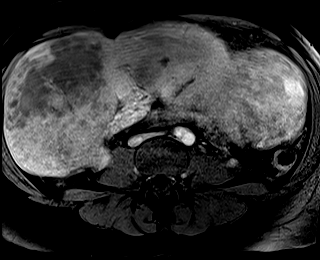

[27 of 48 positions shown; findings below may reference images not displayed]

FINDINGS: COMBINED FINDINGS FOR BOTH MR ABDOMEN AND PELVIS

Lower chest: Chest unremarkable.

Hepatobiliary: No suspicious focal abnormality within the liver
parenchyma. There is no evidence for gallstones, gallbladder wall
thickening, or pericholecystic fluid. No intrahepatic or
extrahepatic biliary dilation.

Pancreas: No focal mass lesion. No dilatation of the main duct. No
intraparenchymal cyst. No peripancreatic edema. Postcontrast imaging
of the pancreas is motion obscured.

Spleen:  No splenomegaly.

Adrenals/Urinary Tract: No adrenal nodule or mass. No suspicious
abnormality in either kidney although fine detail is obscured on
postcontrast imaging due to motion artifact. No hydroureter. Bladder
is nondistended.

Stomach/Bowel: Stomach unremarkable. No evidence for bowel
obstruction.

Vascular/Lymphatic: No abdominal aortic aneurysm no abdominal
lymphadenopathy. No pelvic lymphadenopathy.

Reproductive:

Uterus: The entire uterus could not be included in a single field of
view by MRI. There are multiple uterine fibroids with bulky
exophytic mass lesions from the uterine fundus bilaterally. In TUM,
measurements are approximately 14.7 x 33.2 x 10.7 cm. Estimated
volume = 2,731 cc. Multiple intramural, submucosal, and subserosal
fibroids of varying size and enhancement characteristics are evident
throughout the uterus. Dominant fibroid is in the anterior lower
uterine segment measuring 9.1 x 7.6 x 7.5 cm.

As noted on the CT scan, there are multiple large exophytic
components arising from the uterine fundus. Dominant left-sided
lesion measures approximately 16.1 x 12.6 x 13.8 cm. Stalk measuring
about 5.4 cm in diameter connected to this lesion is visible on
coronal image 31 of series 4.

A second right-sided exophytic fundal lesion measures 22.2 x 10.7 x
10.7 cm.

Multiple other smaller exophytic fundal fibroid lesions evident.

As in the uterus, these exophytic components are markedly
heterogeneous and nodular showing variable degrees of enhancement.

Right ovary: Right ovary is displaced high into the right,
positioned at the level of the right iliac crest. Ovary measures
approximately 2.9 x 3.1 x 1.2 cm and unremarkable in appearance.
Parenchyma well seen on axial T2 image 4 of series 7.

Left ovary: Left ovary is high and lateral, above the level of the
left iliac crest and compressed by the enlarged uterine process.
Left ovary measures approximately 2.8 x 2.8 x 1.1 cm and is normal
in appearance (see axial T2 image 8 of series 7).

Other:  Small volume free fluid in the pelvis.

Musculoskeletal: No focal suspicious marrow enhancement within the
visualized bony anatomy.
IMPRESSION: 1. Markedly enlarged uterus with multiple bulky exophytic lesions of
varying size and enhancement characteristics. These changes are
probably all related to benign fibroid disease and there are no
overt signs of malignancy such as adjacent organ invasion or
metastatic disease. However, leiomyosarcoma and fibroid degeneration
cannot be reliably discriminated on imaging.
2. Bilateral ovaries are normal in size and appearance.
3. Small volume free fluid in the pelvis.

## 2021-01-27 MED ORDER — GADOBUTROL 1 MMOL/ML IV SOLN
7.5000 mL | Freq: Once | INTRAVENOUS | Status: AC | PRN
  Administered 2021-01-27: 7.5 mL via INTRAVENOUS

## 2021-02-03 ENCOUNTER — Telehealth: Payer: BC Managed Care – PPO

## 2021-02-04 NOTE — Telephone Encounter (Signed)
Called pt to clarify if FMLA papers that we received were to be filled out for leave now or after surgery. Pt stated they are for after surgery.

## 2021-02-08 ENCOUNTER — Ambulatory Visit (INDEPENDENT_AMBULATORY_CARE_PROVIDER_SITE_OTHER): Payer: BC Managed Care – PPO | Admitting: Obstetrics & Gynecology

## 2021-02-08 ENCOUNTER — Other Ambulatory Visit (HOSPITAL_COMMUNITY)
Admission: RE | Admit: 2021-02-08 | Discharge: 2021-02-08 | Disposition: A | Discharge status: Home / Self Care | Payer: BC Managed Care – PPO | Source: Ambulatory Visit | Attending: Obstetrics & Gynecology | Admitting: Obstetrics & Gynecology

## 2021-02-08 ENCOUNTER — Encounter (HOSPITAL_BASED_OUTPATIENT_CLINIC_OR_DEPARTMENT_OTHER): Payer: Self-pay | Admitting: Obstetrics & Gynecology

## 2021-02-08 ENCOUNTER — Other Ambulatory Visit: Payer: Self-pay

## 2021-02-08 VITALS — BP 133/89 | HR 70 | Wt 178.0 lb

## 2021-02-08 DIAGNOSIS — N921 Excessive and frequent menstruation with irregular cycle: Secondary | ICD-10-CM | POA: Insufficient documentation

## 2021-02-08 DIAGNOSIS — Z8619 Personal history of other infectious and parasitic diseases: Secondary | ICD-10-CM | POA: Insufficient documentation

## 2021-02-08 DIAGNOSIS — Z113 Encounter for screening for infections with a predominantly sexual mode of transmission: Secondary | ICD-10-CM

## 2021-02-08 DIAGNOSIS — D252 Subserosal leiomyoma of uterus: Secondary | ICD-10-CM

## 2021-02-08 DIAGNOSIS — D251 Intramural leiomyoma of uterus: Secondary | ICD-10-CM

## 2021-02-08 DIAGNOSIS — I10 Essential (primary) hypertension: Secondary | ICD-10-CM

## 2021-02-08 MED ORDER — HYDROCODONE-ACETAMINOPHEN 5-325 MG PO TABS: 1.0000 | ORAL_TABLET | Freq: Four times a day (QID) | ORAL | 0 refills | Status: DC | PRN

## 2021-02-08 NOTE — Progress Notes (Signed)
GYNECOLOGY  VISIT  CC:   Endometrial biopsy, discuss additional plans  HPI: 45 y.o. G0P0000 Married Tanya Murray here for endometrial biopsy for continued evaluation of her grossly enlarged uterus.  MRI has been performed showing what just looks like very large fibroids, superiorly displaced normal ovaries.  Bleeding has continued but is not heavy.  On oral progesterone.  Seeing hematology tomorrow for follow up.   D/w pt if biopsy is negative, feel need to proceed with hysterectomy.  Will have gyn/oncology help.  Hopefully can plan pre-operative uterine artery embolization to help with bleeding control during surgery.  Pt understands will have large and long midline incision.  Procedure, risks and benefits discussed.  Hospital stay, recovery and pain management all discussed.  Risks discussed including but not limited to bleeding, risk of receiving a  transfusion, infection, 2-3% risk of bowel/bladder/ureteral/vascular injury discussed as well as possible need for additional surgery if injury does occur discussed.  DVT/PE and rare risk of death discussed.  My actual complications with prior surgeries discussed.  Vaginal cuff dehiscence discussed.  Hernia formation discussed.  Patient aware if pathology abnormal she may need additional treatment.  All questions answered.    Has been given some pain medication due to her discomfort with enlarged uterus.  Needs refill.  GYNECOLOGIC HISTORY: No LMP recorded (lmp unknown). Contraception: married to Murray  Patient Active Problem List   Diagnosis Date Noted  . 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 Iron deficiency anemia 01/06/2021  . Hypertension 01/06/2021  . Intramural and subserous leiomyoma of uterus 01/06/2021  . Iron deficiency 12/09/2020  . 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 Adopted     History reviewed. No pertinent surgical history.  MEDS:    Current Outpatient Medications on File Prior to Visit  Medication Sig Dispense Refill  . FeFum-FePoly-FA-B Cmp-C-Biot (INTEGRA PLUS) CAPS Take 1 capsule by mouth every morning. 30 capsule 2  . hydrochlorothiazide (HYDRODIURIL) 25 MG tablet Take 1 tablet (25 mg total) by mouth daily. 30 tablet 1  . HYDROcodone-acetaminophen (NORCO/VICODIN) 5-325 MG tablet Take 1-2 tablets by mouth every 6 (six) hours as needed for moderate pain. 20 tablet 0  . megestrol (MEGACE) 40 MG tablet Take 1 tablet (40 mg total) by mouth daily. 90 tablet 1  . ondansetron (ZOFRAN) 4 MG tablet Take 1 tablet (4 mg total) by mouth every 6 (six) hours as needed for nausea. 20 tablet 0  . tranexamic acid (LYSTEDA) 650 MG TABS tablet Take 2 tablets (1,300 mg total) by mouth 3 (three) times daily. 30 tablet 1  . metroNIDAZOLE (FLAGYL) 500 MG tablet Take 1 tablet (500 mg total) by mouth 1 day or 1 dose. Take all 4 tabs (2 grams) together at the same time 4 tablet 0   No current facility-administered medications on file prior to visit.    ALLERGIES: Patient has no known allergies.  Family History  Adopted: Yes    SH:  Married, non smoker  Review of Systems  Constitutional: Negative.   Respiratory: Negative.   Cardiovascular: Negative.   Gastrointestinal: Positive for abdominal distention and abdominal pain.  Genitourinary: Positive for menstrual problem.  Psychiatric/Behavioral: Negative.     PHYSICAL EXAMINATION:    BP 133/89   Pulse 70   Wt 178 lb (80.7 kg)   LMP  (LMP Unknown)   BMI 27.26 kg/m     General appearance: alert, cooperative and appears stated age  CV:  Regular rate and rhythm Lungs:  clear to auscultation, no wheezes, rales or rhonchi, symmetric air entry Abdomen: significantly enlarged uterus almost up to costal margin, some distention due to size of uterus noted Lymph:  no inguinal LAD noted  Pelvic: External genitalia:  no lesions              Urethra:  normal appearing urethra with no  masses, tenderness or lesions              Bartholins and Skenes: normal                 Vagina: normal appearing vagina with normal color and discharge, no lesions              Cervix: no lesions              Bimanual Exam:  Uterus: significantly enlarged uterus              Adnexa: no mass, fullness, tenderness  Endometrial biopsy recommended.  Discussed with patient.  Verbal and written consent obtained.   Procedure:  Speculum placed.  Cervix visualized and cleansed with betadine prep.  A single toothed tenaculum was applied to the anterior lip of the cervix.  Endometrial pipelle was advanced through the cervix into the endometrial cavity without difficulty.  Pipelle passed to 12 cm.  Suction applied and pipelle removed with good tissue sample obtained.  Tenculum removed.  No bleeding noted.  Patient tolerated procedure well.  Chaperone, Octaviano Batty, CMA, was present for exam.  Assessment/Plan: 1. Menorrhagia with irregular cycle - Surgical pathology( [place]/ [facility]) - negative, will proceed with hysterectomy planning  2. History of trichomoniasis - Cervicovaginal ancillary only( [place])  3. Intramural and subserous leiomyoma of uterus - HYDROcodone-acetaminophen (NORCO/VICODIN) 5-325 MG tablet; Take 1-2 tablets by mouth every 6 (six) hours as needed for moderate pain.  Dispense: 20 tablet; Refill: 0  4. Primary hypertension

## 2021-02-09 ENCOUNTER — Other Ambulatory Visit: Payer: Self-pay | Admitting: Medical Oncology

## 2021-02-09 ENCOUNTER — Inpatient Hospital Stay: Payer: BC Managed Care – PPO | Admitting: Internal Medicine

## 2021-02-09 ENCOUNTER — Encounter: Payer: Self-pay | Admitting: Internal Medicine

## 2021-02-09 ENCOUNTER — Inpatient Hospital Stay: Payer: BC Managed Care – PPO | Attending: Physician Assistant

## 2021-02-09 ENCOUNTER — Encounter (HOSPITAL_BASED_OUTPATIENT_CLINIC_OR_DEPARTMENT_OTHER): Payer: Self-pay

## 2021-02-09 VITALS — BP 127/83 | HR 72 | Temp 97.3°F | Resp 19 | Ht 67.75 in | Wt 178.5 lb

## 2021-02-09 DIAGNOSIS — Z79899 Other long term (current) drug therapy: Secondary | ICD-10-CM | POA: Diagnosis not present

## 2021-02-09 DIAGNOSIS — D251 Intramural leiomyoma of uterus: Secondary | ICD-10-CM

## 2021-02-09 DIAGNOSIS — D472 Monoclonal gammopathy: Secondary | ICD-10-CM | POA: Insufficient documentation

## 2021-02-09 DIAGNOSIS — D500 Iron deficiency anemia secondary to blood loss (chronic): Secondary | ICD-10-CM

## 2021-02-09 DIAGNOSIS — N920 Excessive and frequent menstruation with regular cycle: Secondary | ICD-10-CM | POA: Insufficient documentation

## 2021-02-09 DIAGNOSIS — D252 Subserosal leiomyoma of uterus: Secondary | ICD-10-CM | POA: Diagnosis not present

## 2021-02-09 DIAGNOSIS — D250 Submucous leiomyoma of uterus: Secondary | ICD-10-CM | POA: Diagnosis not present

## 2021-02-09 LAB — CBC WITH DIFFERENTIAL (CANCER CENTER ONLY)
Abs Immature Granulocytes: 0.01 10*3/uL (ref 0.00–0.07)
Basophils Absolute: 0.1 10*3/uL (ref 0.0–0.1)
Basophils Relative: 1 %
Eosinophils Absolute: 0.2 10*3/uL (ref 0.0–0.5)
Eosinophils Relative: 4 %
HCT: 37.3 % (ref 36.0–46.0)
Hemoglobin: 11.6 g/dL — ABNORMAL LOW (ref 12.0–15.0)
Immature Granulocytes: 0 %
Lymphocytes Relative: 29 %
Lymphs Abs: 1.7 10*3/uL (ref 0.7–4.0)
MCH: 26.8 pg (ref 26.0–34.0)
MCHC: 31.1 g/dL (ref 30.0–36.0)
MCV: 86.1 fL (ref 80.0–100.0)
Monocytes Absolute: 0.3 10*3/uL (ref 0.1–1.0)
Monocytes Relative: 6 %
Neutro Abs: 3.5 10*3/uL (ref 1.7–7.7)
Neutrophils Relative %: 60 %
Platelet Count: 310 10*3/uL (ref 150–400)
RBC: 4.33 MIL/uL (ref 3.87–5.11)
RDW: 19.9 % — ABNORMAL HIGH (ref 11.5–15.5)
WBC Count: 5.8 10*3/uL (ref 4.0–10.5)
nRBC: 0 % (ref 0.0–0.2)

## 2021-02-09 LAB — IRON AND TIBC
Iron: 58 ug/dL (ref 41–142)
Saturation Ratios: 21 % (ref 21–57)
TIBC: 284 ug/dL (ref 236–444)
UIBC: 225 ug/dL (ref 120–384)

## 2021-02-09 LAB — CERVICOVAGINAL ANCILLARY ONLY
Comment: NEGATIVE
Trichomonas: NEGATIVE

## 2021-02-09 LAB — FERRITIN: Ferritin: 318 ng/mL — ABNORMAL HIGH (ref 11–307)

## 2021-02-09 LAB — SURGICAL PATHOLOGY

## 2021-02-09 NOTE — Progress Notes (Signed)
Spoke to the pharmacy staff at [facility] on [address] re: PA outcome. RX went through and pt will be contacted when the prescription is ready for pick  Up. --- Outcome Additional Information Required Your PA has been resolved, no additional PA is required. For further inquiries please contact the number on the back of the member prescription card. (Message [phone]) --- 02/08/2021 Submitted PA for HYDROcodone-Acetaminophen 5-325MG  tablets on Cover my Meds. Key: [id number] Rx #: [id number] Outcome: PENDING

## 2021-02-09 NOTE — Progress Notes (Signed)
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 Last dose on 01/10/21.  INTERVAL HISTORY: Tanya Murray 45 y.o. female returns to the clinic today for follow-up visit.  The patient is feeling fine today with no concerning complaints.  She felt much better after starting the iron infusion.  She was diagnosed with uterine leiomyoma and she is expected to have surgery in June 2022.  The patient denied having any current chest pain, shortness of breath, cough or hemoptysis.  She denied having any fever or chills.  She has no nausea, vomiting, diarrhea or constipation.  She had repeat blood work today including CBC, iron study and ferritin and she is here for evaluation and discussion of her lab results.  MEDICAL HISTORY: Past Medical History:  Diagnosis Date  . Adopted     ALLERGIES:  has No Known Allergies.  MEDICATIONS:  Current Outpatient Medications  Medication Sig Dispense Refill  . FeFum-FePoly-FA-B Cmp-C-Biot (INTEGRA PLUS) CAPS Take 1 capsule by mouth every Murray. 30 capsule 2  . hydrochlorothiazide (HYDRODIURIL) 25 MG tablet Take 1 tablet (25 mg total) by mouth daily. 30 tablet 1  . HYDROcodone-acetaminophen (NORCO/VICODIN) 5-325 MG tablet Take 1-2 tablets by mouth every 6 (six) hours as needed for moderate pain. 20 tablet 0  . megestrol (MEGACE) 40 MG tablet Take 1 tablet (40 mg total) by mouth daily. 90 tablet 1  . ondansetron (ZOFRAN) 4 MG tablet Take 1 tablet (4 mg total) by mouth every 6 (six) hours as needed for nausea. 20 tablet 0  . tranexamic acid (LYSTEDA) 650 MG TABS tablet Take 2 tablets (1,300 mg total) by mouth 3 (three) times daily. 30 tablet 1    No current facility-administered medications for this visit.    SURGICAL HISTORY: No past surgical history on file.  REVIEW OF SYSTEMS:  A comprehensive review of systems was negative.   PHYSICAL EXAMINATION: General appearance: alert, cooperative and no distress Head: Normocephalic, without obvious abnormality, atraumatic Neck: no adenopathy, no JVD, supple, symmetrical, trachea midline and thyroid not enlarged, symmetric, no tenderness/mass/nodules Lymph nodes: Cervical, supraclavicular, and axillary nodes normal. Resp: clear to auscultation bilaterally Back: symmetric, no curvature. ROM normal. No CVA tenderness. Cardio: regular rate and rhythm, S1, S2 normal, no murmur, click, rub or gallop GI: soft, non-tender; bowel sounds normal; no masses,  no organomegaly Extremities: extremities normal, atraumatic, no cyanosis or edema  ECOG PERFORMANCE STATUS: 1 - Symptomatic but completely ambulatory  Blood pressure 127/83, pulse 72, temperature (!) 97.3 F (36.3 C), temperature source Tympanic, resp. rate 19, height 5' 7.75" (1.721 m), weight 178 lb 8 oz (81 kg), SpO2 100 %.  LABORATORY DATA: Lab Results  Component Value Date   WBC 5.1 12/16/2020   HGB 9.6 (L) 12/16/2020   HCT 35.1 (L) 12/16/2020   MCV 71.2 (L) 12/16/2020   PLT 167 12/16/2020      Chemistry      Component Value Date/Time   NA 139 12/16/2020 0830   K 4.0 12/16/2020 0830   CL 109 12/16/2020 0830   CO2 23 12/16/2020 0830   BUN 9 12/16/2020 0830   CREATININE 0.80 12/16/2020 0830  Component Value Date/Time   CALCIUM 9.4 12/16/2020 0830   ALKPHOS 56 12/16/2020 0830   AST 37 12/16/2020 0830   ALT 38 12/16/2020 0830   BILITOT 1.0 12/16/2020 0830       RADIOGRAPHIC STUDIES: MR PELVIS W WO CONTRAST  Result Date: 01/28/2021 CLINICAL DATA:  Uterine fibroid.  Abnormal uterine bleeding. EXAM: MRI ABDOMEN AND PELVIS WITHOUT AND WITH CONTRAST TECHNIQUE: Multiplanar multisequence MR imaging of the abdomen  and pelvis was performed both before and after the administration of intravenous contrast. CONTRAST:  7.46mL GADAVIST GADOBUTROL 1 MMOL/ML IV SOLN COMPARISON:  CT scan 12/09/2020. FINDINGS: COMBINED FINDINGS FOR BOTH MR ABDOMEN AND PELVIS Lower chest: Chest unremarkable. Hepatobiliary: No suspicious focal abnormality within the liver parenchyma. There is no evidence for gallstones, gallbladder wall thickening, or pericholecystic fluid. No intrahepatic or extrahepatic biliary dilation. Pancreas: No focal mass lesion. No dilatation of the main duct. No intraparenchymal cyst. No peripancreatic edema. Postcontrast imaging of the pancreas is motion obscured. Spleen:  No splenomegaly. Adrenals/Urinary Tract: No adrenal nodule or mass. No suspicious abnormality in either kidney although fine detail is obscured on postcontrast imaging due to motion artifact. No hydroureter. Bladder is nondistended. Stomach/Bowel: Stomach unremarkable. No evidence for bowel obstruction. Vascular/Lymphatic: No abdominal aortic aneurysm no abdominal lymphadenopathy. No pelvic lymphadenopathy. Reproductive: Uterus: The entire uterus could not be included in a single field of view by MRI. There are multiple uterine fibroids with bulky exophytic mass lesions from the uterine fundus bilaterally. In toto, measurements are approximately 14.7 x 33.2 x 10.7 cm. Estimated volume = 2,731 cc. Multiple intramural, submucosal, and subserosal fibroids of varying size and enhancement characteristics are evident throughout the uterus. Dominant fibroid is in the anterior lower uterine segment measuring 9.1 x 7.6 x 7.5 cm. As noted on the CT scan, there are multiple large exophytic components arising from the uterine fundus. Dominant left-sided lesion measures approximately 16.1 x 12.6 x 13.8 cm. Stalk measuring about 5.4 cm in diameter connected to this lesion is visible on coronal image 31 of series 4. A second right-sided exophytic fundal lesion measures 22.2  x 10.7 x 10.7 cm. Multiple other smaller exophytic fundal fibroid lesions evident. As in the uterus, these exophytic components are markedly heterogeneous and nodular showing variable degrees of enhancement. Right ovary: Right ovary is displaced high into the right, positioned at the level of the right iliac crest. Ovary measures approximately 2.9 x 3.1 x 1.2 cm and unremarkable in appearance. Parenchyma well seen on axial T2 image 4 of series 7. Left ovary: Left ovary is high and lateral, above the level of the left iliac crest and compressed by the enlarged uterine process. Left ovary measures approximately 2.8 x 2.8 x 1.1 cm and is normal in appearance (see axial T2 image 8 of series 7). Other:  Small volume free fluid in the pelvis. Musculoskeletal: No focal suspicious marrow enhancement within the visualized bony anatomy. IMPRESSION: 1. Markedly enlarged uterus with multiple bulky exophytic lesions of varying size and enhancement characteristics. These changes are probably all related to benign fibroid disease and there are no overt signs of malignancy such as adjacent organ invasion or metastatic disease. However, leiomyosarcoma and fibroid degeneration cannot be reliably discriminated on imaging. 2. Bilateral ovaries are normal in size and appearance. 3. Small volume free fluid in the pelvis. Electronically Signed   By: Misty Stanley M.D.   On: 01/28/2021 09:16   MR ABDOMEN WWO CONTRAST  Result Date: 01/28/2021 CLINICAL DATA:  Uterine fibroid.  Abnormal  uterine bleeding. EXAM: MRI ABDOMEN AND PELVIS WITHOUT AND WITH CONTRAST TECHNIQUE: Multiplanar multisequence MR imaging of the abdomen and pelvis was performed both before and after the administration of intravenous contrast. CONTRAST:  7.56mL GADAVIST GADOBUTROL 1 MMOL/ML IV SOLN COMPARISON:  CT scan 12/09/2020. FINDINGS: COMBINED FINDINGS FOR BOTH MR ABDOMEN AND PELVIS Lower chest: Chest unremarkable. Hepatobiliary: No suspicious focal abnormality within  the liver parenchyma. There is no evidence for gallstones, gallbladder wall thickening, or pericholecystic fluid. No intrahepatic or extrahepatic biliary dilation. Pancreas: No focal mass lesion. No dilatation of the main duct. No intraparenchymal cyst. No peripancreatic edema. Postcontrast imaging of the pancreas is motion obscured. Spleen:  No splenomegaly. Adrenals/Urinary Tract: No adrenal nodule or mass. No suspicious abnormality in either kidney although fine detail is obscured on postcontrast imaging due to motion artifact. No hydroureter. Bladder is nondistended. Stomach/Bowel: Stomach unremarkable. No evidence for bowel obstruction. Vascular/Lymphatic: No abdominal aortic aneurysm no abdominal lymphadenopathy. No pelvic lymphadenopathy. Reproductive: Uterus: The entire uterus could not be included in a single field of view by MRI. There are multiple uterine fibroids with bulky exophytic mass lesions from the uterine fundus bilaterally. In toto, measurements are approximately 14.7 x 33.2 x 10.7 cm. Estimated volume = 2,731 cc. Multiple intramural, submucosal, and subserosal fibroids of varying size and enhancement characteristics are evident throughout the uterus. Dominant fibroid is in the anterior lower uterine segment measuring 9.1 x 7.6 x 7.5 cm. As noted on the CT scan, there are multiple large exophytic components arising from the uterine fundus. Dominant left-sided lesion measures approximately 16.1 x 12.6 x 13.8 cm. Stalk measuring about 5.4 cm in diameter connected to this lesion is visible on coronal image 31 of series 4. A second right-sided exophytic fundal lesion measures 22.2 x 10.7 x 10.7 cm. Multiple other smaller exophytic fundal fibroid lesions evident. As in the uterus, these exophytic components are markedly heterogeneous and nodular showing variable degrees of enhancement. Right ovary: Right ovary is displaced high into the right, positioned at the level of the right iliac crest. Ovary  measures approximately 2.9 x 3.1 x 1.2 cm and unremarkable in appearance. Parenchyma well seen on axial T2 image 4 of series 7. Left ovary: Left ovary is high and lateral, above the level of the left iliac crest and compressed by the enlarged uterine process. Left ovary measures approximately 2.8 x 2.8 x 1.1 cm and is normal in appearance (see axial T2 image 8 of series 7). Other:  Small volume free fluid in the pelvis. Musculoskeletal: No focal suspicious marrow enhancement within the visualized bony anatomy. IMPRESSION: 1. Markedly enlarged uterus with multiple bulky exophytic lesions of varying size and enhancement characteristics. These changes are probably all related to benign fibroid disease and there are no overt signs of malignancy such as adjacent organ invasion or metastatic disease. However, leiomyosarcoma and fibroid degeneration cannot be reliably discriminated on imaging. 2. Bilateral ovaries are normal in size and appearance. 3. Small volume free fluid in the pelvis. Electronically Signed   By: Misty Stanley M.D.   On: 01/28/2021 09:16    ASSESSMENT AND PLAN: This is a very pleasant 45 years old African-American female with history of iron deficiency anemia secondary to uterine fibroid and menorrhagia in addition to MGUS.  The patient started treatment with iron infusion with Venofer and tolerating it fairly well.  She is feeling much better now. She had repeat CBC, iron study and ferritin performed earlier today.  Her lab results are still pending.  The  patient is feeling better.  I recommended to continue on observation with repeat myeloma panel and iron study in 6 months. For the large uterine fibroid, she is expected to have surgery next months. The patient was advised to call immediately if she has any other concerning symptoms in the interval. The patient voices understanding of current disease status and treatment options and is in agreement with the current care plan.  All questions  were answered. The patient knows to call the clinic with any problems, questions or concerns. We can certainly see the patient much sooner if necessary.  The total time spent in the appointment was 20 minutes.  Disclaimer: This note was dictated with voice recognition software. Similar sounding words can inadvertently be transcribed and may not be corrected upon review.

## 2021-02-10 ENCOUNTER — Encounter: Payer: Self-pay | Admitting: *Deleted

## 2021-02-10 ENCOUNTER — Ambulatory Visit
Admission: RE | Admit: 2021-02-10 | Discharge: 2021-02-10 | Disposition: A | Discharge status: Home / Self Care | Payer: BC Managed Care – PPO | Source: Ambulatory Visit | Attending: Obstetrics & Gynecology | Admitting: Obstetrics & Gynecology

## 2021-02-10 DIAGNOSIS — D259 Leiomyoma of uterus, unspecified: Secondary | ICD-10-CM

## 2021-02-10 HISTORY — PX: IR RADIOLOGIST EVAL & MGMT: IMG5224

## 2021-02-10 NOTE — Consult Note (Signed)
Chief Complaint:  Symptomatic uterine fibroids  Referring Physician(s): Miller,Mary S  History of Present Illness: Tanya Murray is a 45 y.o. female with very large/massive symptomatic uterine fibroids, abnormal uterine bleeding with associated iron deficiency anemia.  Most recently the anemia has resulted in iron infusion as well as blood transfusion.  Fibroids have enlarged over the last couple of years.  Symptoms have slowly progressed.  Review of her menstrual cycle performed.  Last menstrual period 01/05/2021.  Cycle length 4 days with all 4 days being heavy.  Frequent pad change every 1-2 hours.  She has very large fibroids occupying the abdomen and pelvis to the liver margin.  She has associated bulk related symptoms with abdominal distention, bloating, and overall "uncomfortable abdominal feeling".  With the recent transfusion she was also started on Megace in March 2022.  She has had no previous fibroid surgery.  No recent GYN infection or antibiotic therapy.  Pap smear and endometrial biopsy were both negative.  She is here today to discuss preoperative uterine fibroid embolization to limit the operative bleeding complications and blood loss.  Past Medical History:  Diagnosis Date  . Adopted     Past Surgical History:  Procedure Laterality Date  . IR RADIOLOGIST EVAL & MGMT  02/10/2021    Allergies: Patient has no known allergies.  Medications: Prior to Admission medications   Medication Sig Start Date End Date Taking? Authorizing Provider  FeFum-FePoly-FA-B Cmp-C-Biot (INTEGRA PLUS) CAPS Take 1 capsule by mouth every morning. 12/16/20   Heilingoetter, Cassandra L, PA-C  hydrochlorothiazide (HYDRODIURIL) 25 MG tablet Take 1 tablet (25 mg total) by mouth daily. 01/04/21   Megan Salon, MD  HYDROcodone-acetaminophen (NORCO/VICODIN) 5-325 MG tablet Take 1-2 tablets by mouth every 6 (six) hours as needed for moderate pain. 02/08/21   Megan Salon, MD  megestrol  (MEGACE) 40 MG tablet Take 1 tablet (40 mg total) by mouth daily. 01/04/21   Megan Salon, MD  ondansetron (ZOFRAN) 4 MG tablet Take 1 tablet (4 mg total) by mouth every 6 (six) hours as needed for nausea. 12/10/20   Raiford Noble Latif, DO  tranexamic acid (LYSTEDA) 650 MG TABS tablet Take 2 tablets (1,300 mg total) by mouth 3 (three) times daily. 01/13/21   Megan Salon, MD     Family History  Adopted: Yes    Social History   Socioeconomic History  . Marital status: Married    Spouse name: Not on file  . Number of children: Not on file  . Years of education: Not on file  . Highest education level: Not on file  Occupational History  . Not on file  Tobacco Use  . Smoking status: Never Smoker  . Smokeless tobacco: Never Used  Vaping Use  . Vaping Use: Never used  Substance and Sexual Activity  . Alcohol use: Yes    Comment: socially  . Drug use: Never  . Sexual activity: Yes    Birth control/protection: None  Other Topics Concern  . Not on file  Social History Narrative  . Not on file   Social Determinants of Health   Financial Resource Strain: Not on file  Food Insecurity: Not on file  Transportation Needs: Not on file  Physical Activity: Not on file  Stress: Not on file  Social Connections: Not on file     Review of Systems: A 12 point ROS discussed and pertinent positives are indicated in the HPI above.  All  other systems are negative.  Review of Systems  Vital Signs: BP (!) 143/88 (BP Location: Right Arm)   Pulse 75   LMP  (LMP Unknown)   SpO2 99%   Physical Exam Constitutional:      General: She is not in acute distress.    Appearance: She is not toxic-appearing or diaphoretic.  Eyes:     General: No scleral icterus.    Conjunctiva/sclera: Conjunctivae normal.  Cardiovascular:     Rate and Rhythm: Normal rate and regular rhythm.     Heart sounds: Murmur heard.    Pulmonary:     Breath sounds: Normal breath sounds. No wheezing.  Abdominal:      General: There is distension.     Palpations: There is mass.     Tenderness: There is abdominal tenderness.     Hernia: No hernia is present.     Comments: Large firm palpable abdominal pelvic mass correlates with the fibroid uterus.     Imaging: MR PELVIS W WO CONTRAST  Result Date: 01/28/2021 CLINICAL DATA:  Uterine fibroid.  Abnormal uterine bleeding. EXAM: MRI ABDOMEN AND PELVIS WITHOUT AND WITH CONTRAST TECHNIQUE: Multiplanar multisequence MR imaging of the abdomen and pelvis was performed both before and after the administration of intravenous contrast. CONTRAST:  7.12mL GADAVIST GADOBUTROL 1 MMOL/ML IV SOLN COMPARISON:  CT scan 12/09/2020. FINDINGS: COMBINED FINDINGS FOR BOTH MR ABDOMEN AND PELVIS Lower chest: Chest unremarkable. Hepatobiliary: No suspicious focal abnormality within the liver parenchyma. There is no evidence for gallstones, gallbladder wall thickening, or pericholecystic fluid. No intrahepatic or extrahepatic biliary dilation. Pancreas: No focal mass lesion. No dilatation of the main duct. No intraparenchymal cyst. No peripancreatic edema. Postcontrast imaging of the pancreas is motion obscured. Spleen:  No splenomegaly. Adrenals/Urinary Tract: No adrenal nodule or mass. No suspicious abnormality in either kidney although fine detail is obscured on postcontrast imaging due to motion artifact. No hydroureter. Bladder is nondistended. Stomach/Bowel: Stomach unremarkable. No evidence for bowel obstruction. Vascular/Lymphatic: No abdominal aortic aneurysm no abdominal lymphadenopathy. No pelvic lymphadenopathy. Reproductive: Uterus: The entire uterus could not be included in a single field of view by MRI. There are multiple uterine fibroids with bulky exophytic mass lesions from the uterine fundus bilaterally. In toto, measurements are approximately 14.7 x 33.2 x 10.7 cm. Estimated volume = 2,731 cc. Multiple intramural, submucosal, and subserosal fibroids of varying size and  enhancement characteristics are evident throughout the uterus. Dominant fibroid is in the anterior lower uterine segment measuring 9.1 x 7.6 x 7.5 cm. As noted on the CT scan, there are multiple large exophytic components arising from the uterine fundus. Dominant left-sided lesion measures approximately 16.1 x 12.6 x 13.8 cm. Stalk measuring about 5.4 cm in diameter connected to this lesion is visible on coronal image 31 of series 4. A second right-sided exophytic fundal lesion measures 22.2 x 10.7 x 10.7 cm. Multiple other smaller exophytic fundal fibroid lesions evident. As in the uterus, these exophytic components are markedly heterogeneous and nodular showing variable degrees of enhancement. Right ovary: Right ovary is displaced high into the right, positioned at the level of the right iliac crest. Ovary measures approximately 2.9 x 3.1 x 1.2 cm and unremarkable in appearance. Parenchyma well seen on axial T2 image 4 of series 7. Left ovary: Left ovary is high and lateral, above the level of the left iliac crest and compressed by the enlarged uterine process. Left ovary measures approximately 2.8 x 2.8 x 1.1 cm and is normal in appearance (see  axial T2 image 8 of series 7). Other:  Small volume free fluid in the pelvis. Musculoskeletal: No focal suspicious marrow enhancement within the visualized bony anatomy. IMPRESSION: 1. Markedly enlarged uterus with multiple bulky exophytic lesions of varying size and enhancement characteristics. These changes are probably all related to benign fibroid disease and there are no overt signs of malignancy such as adjacent organ invasion or metastatic disease. However, leiomyosarcoma and fibroid degeneration cannot be reliably discriminated on imaging. 2. Bilateral ovaries are normal in size and appearance. 3. Small volume free fluid in the pelvis. Electronically Signed   By: Misty Stanley M.D.   On: 01/28/2021 09:16   MR ABDOMEN WWO CONTRAST  Result Date:  01/28/2021 CLINICAL DATA:  Uterine fibroid.  Abnormal uterine bleeding. EXAM: MRI ABDOMEN AND PELVIS WITHOUT AND WITH CONTRAST TECHNIQUE: Multiplanar multisequence MR imaging of the abdomen and pelvis was performed both before and after the administration of intravenous contrast. CONTRAST:  7.61mL GADAVIST GADOBUTROL 1 MMOL/ML IV SOLN COMPARISON:  CT scan 12/09/2020. FINDINGS: COMBINED FINDINGS FOR BOTH MR ABDOMEN AND PELVIS Lower chest: Chest unremarkable. Hepatobiliary: No suspicious focal abnormality within the liver parenchyma. There is no evidence for gallstones, gallbladder wall thickening, or pericholecystic fluid. No intrahepatic or extrahepatic biliary dilation. Pancreas: No focal mass lesion. No dilatation of the main duct. No intraparenchymal cyst. No peripancreatic edema. Postcontrast imaging of the pancreas is motion obscured. Spleen:  No splenomegaly. Adrenals/Urinary Tract: No adrenal nodule or mass. No suspicious abnormality in either kidney although fine detail is obscured on postcontrast imaging due to motion artifact. No hydroureter. Bladder is nondistended. Stomach/Bowel: Stomach unremarkable. No evidence for bowel obstruction. Vascular/Lymphatic: No abdominal aortic aneurysm no abdominal lymphadenopathy. No pelvic lymphadenopathy. Reproductive: Uterus: The entire uterus could not be included in a single field of view by MRI. There are multiple uterine fibroids with bulky exophytic mass lesions from the uterine fundus bilaterally. In toto, measurements are approximately 14.7 x 33.2 x 10.7 cm. Estimated volume = 2,731 cc. Multiple intramural, submucosal, and subserosal fibroids of varying size and enhancement characteristics are evident throughout the uterus. Dominant fibroid is in the anterior lower uterine segment measuring 9.1 x 7.6 x 7.5 cm. As noted on the CT scan, there are multiple large exophytic components arising from the uterine fundus. Dominant left-sided lesion measures approximately  16.1 x 12.6 x 13.8 cm. Stalk measuring about 5.4 cm in diameter connected to this lesion is visible on coronal image 31 of series 4. A second right-sided exophytic fundal lesion measures 22.2 x 10.7 x 10.7 cm. Multiple other smaller exophytic fundal fibroid lesions evident. As in the uterus, these exophytic components are markedly heterogeneous and nodular showing variable degrees of enhancement. Right ovary: Right ovary is displaced high into the right, positioned at the level of the right iliac crest. Ovary measures approximately 2.9 x 3.1 x 1.2 cm and unremarkable in appearance. Parenchyma well seen on axial T2 image 4 of series 7. Left ovary: Left ovary is high and lateral, above the level of the left iliac crest and compressed by the enlarged uterine process. Left ovary measures approximately 2.8 x 2.8 x 1.1 cm and is normal in appearance (see axial T2 image 8 of series 7). Other:  Small volume free fluid in the pelvis. Musculoskeletal: No focal suspicious marrow enhancement within the visualized bony anatomy. IMPRESSION: 1. Markedly enlarged uterus with multiple bulky exophytic lesions of varying size and enhancement characteristics. These changes are probably all related to benign fibroid disease and there are no  overt signs of malignancy such as adjacent organ invasion or metastatic disease. However, leiomyosarcoma and fibroid degeneration cannot be reliably discriminated on imaging. 2. Bilateral ovaries are normal in size and appearance. 3. Small volume free fluid in the pelvis. Electronically Signed   By: Misty Stanley M.D.   On: 01/28/2021 09:16   IR Radiologist Eval & Mgmt  Result Date: 02/10/2021 Please refer to notes tab for details about interventional procedure. (Op Note)   Labs:  CBC: Recent Labs    12/09/20 1326 12/10/20 0422 12/10/20 1755 12/16/20 0830 02/09/21 0835  WBC 3.0* 4.9  --  5.1 5.8  HGB 4.9* 6.9* 8.8* 9.6* 11.6*  HCT 21.1* 25.4* 31.6* 35.1* 37.3  PLT 145* 104*  --   167 310    COAGS: Recent Labs    12/09/20 1326  INR 1.1    BMP: Recent Labs    12/09/20 1326 12/10/20 0422 12/16/20 0830  NA 138 135 139  K 3.8 3.7 4.0  CL 107 106 109  CO2 22 20* 23  GLUCOSE 94 90 82  BUN 13 13 9   CALCIUM 9.8 9.2 9.4  CREATININE 0.65 0.64 0.80  GFRNONAA >60 >60 >60    LIVER FUNCTION TESTS: Recent Labs    12/09/20 1326 12/10/20 0422 12/16/20 0830  BILITOT 1.8* 2.8* 1.0  AST 20 21 37  ALT 17 18 38  ALKPHOS 47 38 56  PROT 8.9* 7.8 8.0  ALBUMIN 4.6 4.1 4.2     Assessment and Plan:  Massive uterine fibroids occupying the abdominal pelvis.  Abnormal uterine bleeding with iron deficiency anemia requiring iron therapy and transfusion.  Preoperative uterine fibroid embolization procedure was discussed in detail including the procedure, risk, benefits and alternatives.  She understands the procedure is done under conscious sedation and does not require general anesthesia.  This procedure will be performed preoperatively, to limit blood loss during the surgery.  Plan: Preoperative uterine fibroid embolization.  Procedure is to be scheduled day before the surgery at [facility].  Thank you for this interesting consult.  I greatly enjoyed meeting Any D Momon and look forward to participating in their care.  A copy of this report was sent to the requesting provider on this date.  Electronically Signed: Greggory Keen 02/10/2021, 3:59 PM   I spent a total of  40 Minutes   in face to face in clinical consultation, greater than 50% of which was counseling/coordinating care for with massive uterine fibroids and abnormal uterine bleeding.

## 2021-02-14 ENCOUNTER — Telehealth: Payer: Self-pay | Admitting: *Deleted

## 2021-02-14 ENCOUNTER — Encounter (HOSPITAL_BASED_OUTPATIENT_CLINIC_OR_DEPARTMENT_OTHER): Payer: Self-pay | Admitting: *Deleted

## 2021-02-14 NOTE — Telephone Encounter (Signed)
Call to patient. Advised surgery date scheduled for 03-31-21 at 70 at [facility] will contact patient with further details related to admission on 6-29 for pre-procedure [place].  Advised letter being sent in mail and visible on My Chart.  Encounter closed.

## 2021-02-18 ENCOUNTER — Other Ambulatory Visit (HOSPITAL_COMMUNITY): Payer: Self-pay | Admitting: Interventional Radiology

## 2021-02-18 ENCOUNTER — Other Ambulatory Visit (HOSPITAL_BASED_OUTPATIENT_CLINIC_OR_DEPARTMENT_OTHER): Payer: Self-pay | Admitting: Obstetrics & Gynecology

## 2021-02-18 ENCOUNTER — Encounter (HOSPITAL_BASED_OUTPATIENT_CLINIC_OR_DEPARTMENT_OTHER): Payer: Self-pay | Admitting: *Deleted

## 2021-02-18 DIAGNOSIS — D259 Leiomyoma of uterus, unspecified: Secondary | ICD-10-CM

## 2021-02-24 ENCOUNTER — Encounter (HOSPITAL_BASED_OUTPATIENT_CLINIC_OR_DEPARTMENT_OTHER): Payer: Self-pay | Admitting: *Deleted

## 2021-03-01 ENCOUNTER — Other Ambulatory Visit (HOSPITAL_BASED_OUTPATIENT_CLINIC_OR_DEPARTMENT_OTHER): Payer: Self-pay | Admitting: Obstetrics & Gynecology

## 2021-03-01 DIAGNOSIS — I10 Essential (primary) hypertension: Secondary | ICD-10-CM

## 2021-03-21 NOTE — Progress Notes (Signed)
DUE TO COVID-19 ONLY ONE VISITOR IS ALLOWED TO COME WITH YOU AND STAY IN THE WAITING ROOM ONLY DURING PRE OP AND PROCEDURE DAY OF SURGERY. THE 1 VISITOR  MAY VISIT WITH YOU AFTER SURGERY IN YOUR PRIVATE ROOM DURING VISITING HOURS ONLY!  YOU NEED TO HAVE A COVID 19 TEST ON__6/27/22 _____ @_______ , THIS TEST MUST BE DONE BEFORE SURGERY,  COVID TESTING SITE [address] [place] [address], IT IS ON THE RIGHT GOING OUT [address] APPROXIMATELY  2 MINUTES PAST [employer] ON THE RIGHT. ONCE YOUR COVID TEST IS COMPLETED,  PLEASE BEGIN THE QUARANTINE INSTRUCTIONS AS OUTLINED IN YOUR HANDOUT.                Tanya Murray  03/21/2021   Your procedure is scheduled on:  03/31/2021   Report to [facility] Main  Entrance   Report to admitting at    [place] [address] AM     Call this number if you have problems the morning of surgery [phone]    REMEMBER: NO  SOLID FOOD CANDY OR GUM AFTER MIDNIGHT. CLEAR LIQUIDS UNTIL   0415am       . NOTHING BY MOUTH EXCEPT CLEAR LIQUIDS UNTIL     0415am  . PLEASE FINISH ENSURE DRINK PER SURGEON ORDER  WHICH NEEDS TO BE COMPLETED AT     0415am  .      CLEAR LIQUID DIET   Foods Allowed                                                                    Coffee and tea, regular and decaf                            Fruit ices (not with fruit pulp)                                      Iced Popsicles                                    Carbonated beverages, regular and diet                                    Cranberry, grape and apple juices Sports drinks like Gatorade Lightly seasoned clear broth or consume(fat free) Sugar, honey syrup ___________________________________________________________________      BRUSH YOUR TEETH MORNING OF SURGERY AND RINSE YOUR MOUTH OUT, NO CHEWING GUM CANDY OR MINTS.     Take these medicines the morning of surgery with A SIP OF WATER:        None  DO NOT TAKE ANY DIABETIC MEDICATIONS DAY OF YOUR  SURGERY                               You may not have any metal on your body including hair pins and  piercings  Do not wear jewelry, make-up, lotions, powders or perfumes, deodorant             Do not wear nail polish on your fingernails.  Do not shave  48 hours prior to surgery.              Men may shave face and neck.   Do not bring valuables to the hospital. [place].  Contacts, dentures or bridgework may not be worn into surgery.  Leave suitcase in the car. After surgery it may be brought to your room.     Patients discharged the day of surgery will not be allowed to drive home. IF YOU ARE HAVING SURGERY AND GOING HOME THE SAME DAY, YOU MUST HAVE AN ADULT TO DRIVE YOU HOME AND BE WITH YOU FOR 24 HOURS. YOU MAY GO HOME BY TAXI OR UBER OR ORTHERWISE, BUT AN ADULT MUST ACCOMPANY YOU HOME AND STAY WITH YOU FOR 24 HOURS.  Name and phone number of your driver:  Special Instructions: N/A              Please read over the following fact sheets you were given: _____________________________________________________________________  [facility] - Preparing for Surgery Before surgery, you can play an important role.  Because skin is not sterile, your skin needs to be as free of germs as possible.  You can reduce the number of germs on your skin by washing with CHG (chlorahexidine gluconate) soap before surgery.  CHG is an antiseptic cleaner which kills germs and bonds with the skin to continue killing germs even after washing. Please DO NOT use if you have an allergy to CHG or antibacterial soaps.  If your skin becomes reddened/irritated stop using the CHG and inform your nurse when you arrive at Short Stay. Do not shave (including legs and underarms) for at least 48 hours prior to the first CHG shower.  You may shave your face/neck. Please follow these instructions carefully:  1.  Shower with CHG Soap the night before surgery and the   morning of Surgery.  2.  If you choose to wash your hair, wash your hair first as usual with your  normal  shampoo.  3.  After you shampoo, rinse your hair and body thoroughly to remove the  shampoo.                           4.  Use CHG as you would any other liquid soap.  You can apply chg directly  to the skin and wash                       Gently with a scrungie or clean washcloth.  5.  Apply the CHG Soap to your body ONLY FROM THE NECK DOWN.   Do not use on face/ open                           Wound or open sores. Avoid contact with eyes, ears mouth and genitals (private parts).                       Wash face,  Genitals (private parts) with your normal soap.             6.  Wash thoroughly, paying special attention to the area where your surgery  will be performed.  7.  Thoroughly rinse your body with warm water from the neck down.  8.  DO NOT shower/wash with your normal soap after using and rinsing off  the CHG Soap.                9.  Pat yourself dry with a clean towel.            10.  Wear clean pajamas.            11.  Place clean sheets on your bed the night of your first shower and do not  sleep with pets. Day of Surgery : Do not apply any lotions/deodorants the morning of surgery.  Please wear clean clothes to the hospital/surgery center.  FAILURE TO FOLLOW THESE INSTRUCTIONS MAY RESULT IN THE CANCELLATION OF YOUR SURGERY PATIENT SIGNATURE_________________________________  NURSE SIGNATURE__________________________________  ________________________________________________________________________

## 2021-03-22 ENCOUNTER — Other Ambulatory Visit (HOSPITAL_COMMUNITY): Payer: Self-pay

## 2021-03-23 ENCOUNTER — Encounter (HOSPITAL_COMMUNITY): Payer: Self-pay

## 2021-03-23 ENCOUNTER — Other Ambulatory Visit: Payer: Self-pay

## 2021-03-23 ENCOUNTER — Encounter (HOSPITAL_COMMUNITY)
Admission: RE | Admit: 2021-03-23 | Discharge: 2021-03-23 | Disposition: A | Discharge status: Home / Self Care | Payer: BC Managed Care – PPO | Source: Ambulatory Visit | Attending: Obstetrics & Gynecology | Admitting: Obstetrics & Gynecology

## 2021-03-23 ENCOUNTER — Other Ambulatory Visit: Payer: Self-pay | Admitting: Student

## 2021-03-23 DIAGNOSIS — Z01818 Encounter for other preprocedural examination: Secondary | ICD-10-CM | POA: Diagnosis not present

## 2021-03-23 HISTORY — DX: Essential (primary) hypertension: I10

## 2021-03-23 HISTORY — DX: Cardiac murmur, unspecified: R01.1

## 2021-03-23 HISTORY — DX: Anemia, unspecified: D64.9

## 2021-03-23 LAB — BASIC METABOLIC PANEL
Anion gap: 7 (ref 5–15)
BUN: 13 mg/dL (ref 6–20)
CO2: 28 mmol/L (ref 22–32)
Calcium: 10.1 mg/dL (ref 8.9–10.3)
Chloride: 101 mmol/L (ref 98–111)
Creatinine, Ser: 0.79 mg/dL (ref 0.44–1.00)
GFR, Estimated: >60 mL/min (ref >60)
Glucose, Bld: 88 mg/dL (ref 70–99)
Potassium: 3.8 mmol/L (ref 3.5–5.1)
Sodium: 136 mmol/L (ref 135–145)

## 2021-03-23 LAB — CBC
HCT: 37.6 % (ref 36.0–46.0)
Hemoglobin: 12.1 g/dL (ref 12.0–15.0)
MCH: 28.9 pg (ref 26.0–34.0)
MCHC: 32.2 g/dL (ref 30.0–36.0)
MCV: 90 fL (ref 80.0–100.0)
Platelets: 325 10*3/uL (ref 150–400)
RBC: 4.18 MIL/uL (ref 3.87–5.11)
RDW: 14.5 % (ref 11.5–15.5)
WBC: 7 10*3/uL (ref 4.0–10.5)
nRBC: 0 % (ref 0.0–0.2)

## 2021-03-23 NOTE — Progress Notes (Addendum)
Anesthesia Review:  PCP: DR Missy Sabins  Cardiologist :none  Chest x-ray : EKG :03/23/21  Echo : Stress test: Cardiac Cath :  Activity level: can do a flight of stairs without difficulty  Sleep Study/ CPAP : none  Fasting Blood Sugar :      / Checks Blood Sugar -- times a day:   Blood Thinner/ Instructions /Last Dose: ASA / Instructions/ Last Dose :   IR Procedure on 03/30/2021 at 130pm Surgery with DR Sabra Heck on 03/31/2021  Spoke with Amy Han in IR on PA phone lines and she stated pt is to be NPO after 0700am on 03/30/21.  Requested orders to be placed by IR for procedure on 03/30/21.  PT was not given Ensure preop drink.  IR clarified that pt would be spending the nite after the procedure on 03/30/21.  PT made aware and voices understanding.   Orders are now in for IR procedure on 03/30/21.

## 2021-03-28 ENCOUNTER — Other Ambulatory Visit (HOSPITAL_COMMUNITY)
Admission: RE | Admit: 2021-03-28 | Discharge: 2021-03-28 | Disposition: A | Discharge status: Home / Self Care | Payer: BC Managed Care – PPO | Source: Ambulatory Visit | Attending: Obstetrics & Gynecology | Admitting: Obstetrics & Gynecology

## 2021-03-28 DIAGNOSIS — Z20822 Contact with and (suspected) exposure to covid-19: Secondary | ICD-10-CM | POA: Insufficient documentation

## 2021-03-28 DIAGNOSIS — Z01812 Encounter for preprocedural laboratory examination: Secondary | ICD-10-CM | POA: Insufficient documentation

## 2021-03-28 LAB — SARS CORONAVIRUS 2 (TAT 6-24 HRS): SARS Coronavirus 2: NEGATIVE

## 2021-03-29 ENCOUNTER — Other Ambulatory Visit: Payer: Self-pay | Admitting: Radiology

## 2021-03-30 ENCOUNTER — Other Ambulatory Visit: Payer: Self-pay

## 2021-03-30 ENCOUNTER — Inpatient Hospital Stay (HOSPITAL_COMMUNITY)
Admission: AD | Admit: 2021-03-30 | Discharge: 2021-04-01 | DRG: 743 | Disposition: A | Discharge status: Home / Self Care | Payer: BC Managed Care – PPO | Source: Ambulatory Visit | Attending: Obstetrics & Gynecology | Admitting: Obstetrics & Gynecology

## 2021-03-30 ENCOUNTER — Ambulatory Visit (HOSPITAL_COMMUNITY)
Admission: RE | Admit: 2021-03-30 | Discharge: 2021-03-30 | Disposition: A | Discharge status: Home / Self Care | Payer: BC Managed Care – PPO | Source: Ambulatory Visit | Attending: Interventional Radiology | Admitting: Interventional Radiology

## 2021-03-30 ENCOUNTER — Encounter (HOSPITAL_COMMUNITY): Payer: Self-pay

## 2021-03-30 DIAGNOSIS — Z888 Allergy status to other drugs, medicaments and biological substances status: Secondary | ICD-10-CM

## 2021-03-30 DIAGNOSIS — Z79899 Other long term (current) drug therapy: Secondary | ICD-10-CM

## 2021-03-30 DIAGNOSIS — D251 Intramural leiomyoma of uterus: Principal | ICD-10-CM | POA: Diagnosis present

## 2021-03-30 DIAGNOSIS — Z20822 Contact with and (suspected) exposure to covid-19: Secondary | ICD-10-CM | POA: Diagnosis present

## 2021-03-30 DIAGNOSIS — D252 Subserosal leiomyoma of uterus: Secondary | ICD-10-CM | POA: Diagnosis present

## 2021-03-30 DIAGNOSIS — D472 Monoclonal gammopathy: Secondary | ICD-10-CM | POA: Diagnosis present

## 2021-03-30 DIAGNOSIS — I10 Essential (primary) hypertension: Secondary | ICD-10-CM | POA: Diagnosis present

## 2021-03-30 DIAGNOSIS — N920 Excessive and frequent menstruation with regular cycle: Secondary | ICD-10-CM | POA: Diagnosis present

## 2021-03-30 DIAGNOSIS — D509 Iron deficiency anemia, unspecified: Secondary | ICD-10-CM | POA: Diagnosis present

## 2021-03-30 DIAGNOSIS — D250 Submucous leiomyoma of uterus: Secondary | ICD-10-CM | POA: Diagnosis present

## 2021-03-30 DIAGNOSIS — K660 Peritoneal adhesions (postprocedural) (postinfection): Secondary | ICD-10-CM | POA: Diagnosis present

## 2021-03-30 DIAGNOSIS — N938 Other specified abnormal uterine and vaginal bleeding: Secondary | ICD-10-CM | POA: Diagnosis present

## 2021-03-30 DIAGNOSIS — D259 Leiomyoma of uterus, unspecified: Secondary | ICD-10-CM | POA: Diagnosis present

## 2021-03-30 DIAGNOSIS — R011 Cardiac murmur, unspecified: Secondary | ICD-10-CM | POA: Diagnosis present

## 2021-03-30 HISTORY — PX: IR US GUIDE VASC ACCESS RIGHT: IMG2390

## 2021-03-30 HISTORY — PX: IR EMBO TUMOR ORGAN ISCHEMIA INFARCT INC GUIDE ROADMAPPING: IMG5449

## 2021-03-30 HISTORY — DX: Leiomyoma of uterus, unspecified: D25.9

## 2021-03-30 HISTORY — PX: IR ANGIOGRAM SELECTIVE EACH ADDITIONAL VESSEL: IMG667

## 2021-03-30 HISTORY — PX: IR ANGIOGRAM PELVIS SELECTIVE OR SUPRASELECTIVE: IMG661

## 2021-03-30 LAB — BASIC METABOLIC PANEL
Anion gap: 7 (ref 5–15)
BUN: 11 mg/dL (ref 6–20)
CO2: 28 mmol/L (ref 22–32)
Calcium: 9.9 mg/dL (ref 8.9–10.3)
Chloride: 98 mmol/L (ref 98–111)
Creatinine, Ser: 0.64 mg/dL (ref 0.44–1.00)
GFR, Estimated: >60 mL/min (ref >60)
Glucose, Bld: 95 mg/dL (ref 70–99)
Potassium: 3.6 mmol/L (ref 3.5–5.1)
Sodium: 133 mmol/L — ABNORMAL LOW (ref 135–145)

## 2021-03-30 LAB — CBC WITH DIFFERENTIAL/PLATELET
Abs Immature Granulocytes: 0.01 10*3/uL (ref 0.00–0.07)
Basophils Absolute: 0 10*3/uL (ref 0.0–0.1)
Basophils Relative: 1 %
Eosinophils Absolute: 0.2 10*3/uL (ref 0.0–0.5)
Eosinophils Relative: 4 %
HCT: 38.9 % (ref 36.0–46.0)
Hemoglobin: 12.4 g/dL (ref 12.0–15.0)
Immature Granulocytes: 0 %
Lymphocytes Relative: 25 %
Lymphs Abs: 1.5 10*3/uL (ref 0.7–4.0)
MCH: 28.7 pg (ref 26.0–34.0)
MCHC: 31.9 g/dL (ref 30.0–36.0)
MCV: 90 fL (ref 80.0–100.0)
Monocytes Absolute: 0.5 10*3/uL (ref 0.1–1.0)
Monocytes Relative: 8 %
Neutro Abs: 3.8 10*3/uL (ref 1.7–7.7)
Neutrophils Relative %: 62 %
Platelets: 278 10*3/uL (ref 150–400)
RBC: 4.32 MIL/uL (ref 3.87–5.11)
RDW: 14.3 % (ref 11.5–15.5)
WBC: 6 10*3/uL (ref 4.0–10.5)
nRBC: 0 % (ref 0.0–0.2)

## 2021-03-30 LAB — PROTIME-INR
INR: 0.9 (ref 0.8–1.2)
Prothrombin Time: 12.4 seconds (ref 11.4–15.2)

## 2021-03-30 LAB — HCG, SERUM, QUALITATIVE: Preg, Serum: NEGATIVE

## 2021-03-30 MED ORDER — HYDROMORPHONE 1 MG/ML IV SOLN
INTRAVENOUS | Status: DC
  Administered 2021-03-30: 4.2 mg via INTRAVENOUS
  Administered 2021-03-30: 1.2 mg via INTRAVENOUS
  Administered 2021-03-31: 0.3 mg via INTRAVENOUS
  Filled 2021-03-30: qty 30

## 2021-03-30 MED ORDER — SODIUM CHLORIDE 0.9 % IV SOLN: INTRAVENOUS | Status: DC

## 2021-03-30 MED ORDER — KETOROLAC TROMETHAMINE 30 MG/ML IJ SOLN
INTRAMUSCULAR | Status: AC
  Filled 2021-03-30: qty 1

## 2021-03-30 MED ORDER — IOHEXOL 300 MG/ML  SOLN
75.0000 mL | Freq: Once | INTRAMUSCULAR | Status: AC | PRN
  Administered 2021-03-30: 50 mL via INTRA_ARTERIAL

## 2021-03-30 MED ORDER — DIPHENHYDRAMINE HCL 12.5 MG/5ML PO ELIX
12.5000 mg | ORAL_SOLUTION | Freq: Four times a day (QID) | ORAL | Status: DC | PRN
  Administered 2021-03-30: 12.5 mg via ORAL
  Filled 2021-03-30 (×2): qty 5

## 2021-03-30 MED ORDER — FENTANYL CITRATE (PF) 100 MCG/2ML IJ SOLN
INTRAMUSCULAR | Status: AC
  Filled 2021-03-30: qty 6

## 2021-03-30 MED ORDER — GELATIN ABSORBABLE 12-7 MM EX MISC
CUTANEOUS | Status: AC
  Administered 2021-03-30: 0.5
  Filled 2021-03-30: qty 1

## 2021-03-30 MED ORDER — ONDANSETRON HCL 4 MG/2ML IJ SOLN: 4.0000 mg | Freq: Four times a day (QID) | INTRAMUSCULAR | Status: DC | PRN

## 2021-03-30 MED ORDER — PROMETHAZINE HCL 25 MG PO TABS: 25.0000 mg | ORAL_TABLET | Freq: Three times a day (TID) | ORAL | Status: DC | PRN

## 2021-03-30 MED ORDER — DOCUSATE SODIUM 100 MG PO CAPS
100.0000 mg | ORAL_CAPSULE | Freq: Two times a day (BID) | ORAL | Status: DC
  Administered 2021-03-30: 100 mg via ORAL
  Filled 2021-03-30: qty 1

## 2021-03-30 MED ORDER — KETOROLAC TROMETHAMINE 30 MG/ML IJ SOLN: 30.0000 mg | INTRAMUSCULAR | Status: DC

## 2021-03-30 MED ORDER — LIDOCAINE HCL (PF) 1 % IJ SOLN
INTRAMUSCULAR | Status: AC | PRN
  Administered 2021-03-30: 10 mL via INTRADERMAL

## 2021-03-30 MED ORDER — IOHEXOL 300 MG/ML  SOLN
50.0000 mL | Freq: Once | INTRAMUSCULAR | Status: AC | PRN
  Administered 2021-03-30: 50 mL via INTRA_ARTERIAL

## 2021-03-30 MED ORDER — KETOROLAC TROMETHAMINE 30 MG/ML IJ SOLN
INTRAMUSCULAR | Status: AC | PRN
  Administered 2021-03-30: 30 mg via INTRAVENOUS

## 2021-03-30 MED ORDER — LIDOCAINE HCL 1 % IJ SOLN
INTRAMUSCULAR | Status: AC
  Filled 2021-03-30: qty 20

## 2021-03-30 MED ORDER — SODIUM CHLORIDE 0.9% FLUSH: 9.0000 mL | INTRAVENOUS | Status: DC | PRN

## 2021-03-30 MED ORDER — GELATIN ABSORBABLE 12-7 MM EX MISC
CUTANEOUS | Status: AC | PRN
  Administered 2021-03-30: .5 via TOPICAL

## 2021-03-30 MED ORDER — SODIUM CHLORIDE 0.9% FLUSH: 3.0000 mL | INTRAVENOUS | Status: DC | PRN

## 2021-03-30 MED ORDER — HYDROCHLOROTHIAZIDE 25 MG PO TABS
25.0000 mg | ORAL_TABLET | Freq: Every day | ORAL | Status: DC
  Administered 2021-04-01: 25 mg via ORAL
  Filled 2021-03-30: qty 1

## 2021-03-30 MED ORDER — DIPHENHYDRAMINE HCL 50 MG/ML IJ SOLN
12.5000 mg | Freq: Four times a day (QID) | INTRAMUSCULAR | Status: DC | PRN
  Administered 2021-03-31: 12.5 mg via INTRAVENOUS
  Filled 2021-03-30: qty 1

## 2021-03-30 MED ORDER — PROMETHAZINE HCL 25 MG RE SUPP
25.0000 mg | Freq: Three times a day (TID) | RECTAL | Status: DC | PRN
  Filled 2021-03-30: qty 1

## 2021-03-30 MED ORDER — MIDAZOLAM HCL 2 MG/2ML IJ SOLN
INTRAMUSCULAR | Status: AC | PRN
  Administered 2021-03-30 (×6): 1 mg via INTRAVENOUS

## 2021-03-30 MED ORDER — IOHEXOL 300 MG/ML  SOLN
100.0000 mL | Freq: Once | INTRAMUSCULAR | Status: AC | PRN
  Administered 2021-03-30: 50 mL via INTRA_ARTERIAL

## 2021-03-30 MED ORDER — CEFAZOLIN SODIUM-DEXTROSE 2-4 GM/100ML-% IV SOLN: 2.0000 g | INTRAVENOUS | Status: AC

## 2021-03-30 MED ORDER — INTEGRA PLUS PO CAPS: 1.0000 | ORAL_CAPSULE | Freq: Every morning | ORAL | Status: DC

## 2021-03-30 MED ORDER — SODIUM CHLORIDE 0.9% FLUSH
3.0000 mL | Freq: Two times a day (BID) | INTRAVENOUS | Status: DC
  Administered 2021-03-30 – 2021-03-31 (×2): 3 mL via INTRAVENOUS

## 2021-03-30 MED ORDER — ADULT MULTIVITAMIN W/MINERALS CH
1.0000 | ORAL_TABLET | Freq: Every day | ORAL | Status: DC
  Administered 2021-04-01: 1 via ORAL
  Filled 2021-03-30: qty 1

## 2021-03-30 MED ORDER — CEFAZOLIN SODIUM-DEXTROSE 2-4 GM/100ML-% IV SOLN
INTRAVENOUS | Status: AC
  Administered 2021-03-30: 2 g via INTRAVENOUS
  Filled 2021-03-30: qty 100

## 2021-03-30 MED ORDER — MIDAZOLAM HCL 2 MG/2ML IJ SOLN
INTRAMUSCULAR | Status: AC
  Filled 2021-03-30: qty 6

## 2021-03-30 MED ORDER — SODIUM CHLORIDE 0.9 % IV SOLN: 250.0000 mL | INTRAVENOUS | Status: DC | PRN

## 2021-03-30 MED ORDER — NALOXONE HCL 0.4 MG/ML IJ SOLN: 0.4000 mg | INTRAMUSCULAR | Status: DC | PRN

## 2021-03-30 MED ORDER — FENTANYL CITRATE (PF) 100 MCG/2ML IJ SOLN
INTRAMUSCULAR | Status: AC | PRN
  Administered 2021-03-30 (×5): 50 ug via INTRAVENOUS

## 2021-03-30 NOTE — H&P (Addendum)
Referring Physician(s): Hale Bogus  Supervising Physician: Aletta Edouard  Patient Status:  [facility] TBA  Chief Complaint:  Symptomatic uterine fibroids   Subjective: Patient familiar to IR service from consultation with Dr. Annamaria Boots on 02/10/2021 to discuss treatment options for symptomatic large uterine fibroids.  She has a history of HTN, MGUS and abnormal uterine bleeding with iron deficiency anemia requiring iron therapy and transfusion.  She is scheduled for hysterectomy on 6/30 and presents today for preoperative uterine fibroid embolization to limit operative bleeding complications and blood loss.  She currently denies fever, headache, chest pain, dyspnea, cough, back pain, nausea, vomiting.  She does have intermittent abdominal discomfort and menorrhagia.  Past Medical History:  Diagnosis Date   Adopted    Anemia    Heart murmur    Hypertension    Past Surgical History:  Procedure Laterality Date   IR RADIOLOGIST EVAL & MGMT  02/10/2021      Allergies: Tape  Medications: Prior to Admission medications   Medication Sig Start Date End Date Taking? Authorizing Provider  FeFum-FePoly-FA-B Cmp-C-Biot (INTEGRA PLUS) CAPS Take 1 capsule by mouth every morning. 12/16/20  Yes Heilingoetter, Cassandra L, PA-C  hydrochlorothiazide (HYDRODIURIL) 25 MG tablet TAKE 1 TABLET(25 MG) BY MOUTH DAILY Patient taking differently: Take 25 mg by mouth daily. 03/01/21  Yes Megan Salon, MD  HYDROcodone-acetaminophen (NORCO/VICODIN) 5-325 MG tablet Take 1-2 tablets by mouth every 6 (six) hours as needed for moderate pain. 02/08/21  Yes Megan Salon, MD  tranexamic acid (LYSTEDA) 650 MG TABS tablet Take 2 tablets (1,300 mg total) by mouth 3 (three) times daily. Patient taking differently: Take 1,300 mg by mouth 3 (three) times daily as needed (at the beginning of menstrual cycle). 01/13/21  Yes Megan Salon, MD  megestrol (MEGACE) 40 MG tablet Take 1 tablet (40 mg total) by mouth  daily. Patient not taking: Reported on 03/21/2021 01/04/21   Megan Salon, MD  ondansetron (ZOFRAN) 4 MG tablet Take 1 tablet (4 mg total) by mouth every 6 (six) hours as needed for nausea. 12/10/20   Raiford Noble Latif, DO     Vital Signs: BP 139/90   Pulse 66   Temp 98.5 F (36.9 C) (Oral)   Resp 18   SpO2 99%   Physical Exam awake, alert.  Chest clear to auscultation bilaterally.  Heart with regular rate and rhythm, positive murmur.  Abdomen distended secondary to large firm palpable fibroid uterus; some mild generalized tenderness to palpation.  Few bowel sounds.  No significant lower extremity edema.  Imaging: No results found.  Labs:  CBC: Recent Labs    12/10/20 0422 12/10/20 1755 12/16/20 0830 02/09/21 0835 03/23/21 0822  WBC 4.9  --  5.1 5.8 7.0  HGB 6.9* 8.8* 9.6* 11.6* 12.1  HCT 25.4* 31.6* 35.1* 37.3 37.6  PLT 104*  --  167 310 325    COAGS: Recent Labs    12/09/20 1326  INR 1.1    BMP: Recent Labs    12/09/20 1326 12/10/20 0422 12/16/20 0830 03/23/21 0822  NA 138 135 139 136  K 3.8 3.7 4.0 3.8  CL 107 106 109 101  CO2 22 20* 23 28  GLUCOSE 94 90 82 88  BUN 13 13 9 13   CALCIUM 9.8 9.2 9.4 10.1  CREATININE 0.65 0.64 0.80 0.79  GFRNONAA >60 >60 >60 >60    LIVER FUNCTION TESTS: Recent Labs    12/09/20 1326 12/10/20 0422 12/16/20 0830  BILITOT 1.8* 2.8*  1.0  AST 20 21 37  ALT 17 18 38  ALKPHOS 47 38 56  PROT 8.9* 7.8 8.0  ALBUMIN 4.6 4.1 4.2    Assessment and Plan: Patient familiar to IR service from consultation with Dr. Annamaria Boots on 02/10/2021 to discuss treatment options for symptomatic large uterine fibroids.  She has a history of HTN,MGUS and abnormal uterine bleeding with iron deficiency anemia requiring iron therapy and transfusion.  She is scheduled for hysterectomy on 6/30 and presents today for preoperative uterine fibroid embolization to limit operative bleeding complications and blood loss.Risks and benefits of procedure were  discussed with the patient including, but not limited to bleeding, infection, vascular injury or contrast induced renal failure.  This interventional procedure involves the use of X-rays and because of the nature of the planned procedure, it is possible that we will have prolonged use of X-ray fluoroscopy.  Potential radiation risks to you include (but are not limited to) the following: - A slightly elevated risk for cancer  several years later in life. This risk is typically less than 0.5% percent. This risk is low in comparison to the normal incidence of human cancer, which is 33% for women and 50% for men according to the [place]. - Radiation induced injury can include skin redness, resembling a rash, tissue breakdown / ulcers and hair loss (which can be temporary or permanent).   The likelihood of either of these occurring depends on the difficulty of the procedure and whether you are sensitive to radiation due to previous procedures, disease, or genetic conditions.   IF your procedure requires a prolonged use of radiation, you will be notified and given written instructions for further action.  It is your responsibility to monitor the irradiated area for the 2 weeks following the procedure and to notify your physician if you are concerned that you have suffered a radiation induced injury.    All of the patient's questions were answered, patient is agreeable to proceed.  Consent signed and in chart.  Post procedure patient will be admitted prior to planned hysterectomy on 6/30 with Dr. Sabra Heck    Electronically Signed: D. Rowe Robert, PA-C 03/30/2021, 12:06 PM   I spent a total of 30 minutes at the the patient's bedside AND on the patient's hospital floor or unit, greater than 50% of which was counseling/coordinating care for bilateral uterine artery /fibroid embolization

## 2021-03-30 NOTE — Anesthesia Preprocedure Evaluation (Addendum)
Anesthesia Evaluation  Patient identified by MRN, date of birth, ID band Patient awake    Reviewed: Allergy & Precautions, H&P , NPO status , Patient's Chart, lab work & pertinent test results  Airway Mallampati: II  TM Distance: >3 FB Neck ROM: Full    Dental no notable dental hx. (+) Teeth Intact, Dental Advisory Given   Pulmonary neg pulmonary ROS,    Pulmonary exam normal breath sounds clear to auscultation       Cardiovascular Exercise Tolerance: Good hypertension, Pt. on medications  Rhythm:Regular Rate:Normal     Neuro/Psych negative neurological ROS  negative psych ROS   GI/Hepatic negative GI ROS, Neg liver ROS,   Endo/Other  negative endocrine ROS  Renal/GU negative Renal ROS  negative genitourinary   Musculoskeletal   Abdominal   Peds  Hematology  (+) Blood dyscrasia, anemia ,   Anesthesia Other Findings   Reproductive/Obstetrics negative OB ROS                            Anesthesia Physical Anesthesia Plan  ASA: 2  Anesthesia Plan: General   Post-op Pain Management:  Regional for Post-op pain   Induction: Intravenous  PONV Risk Score and Plan: 4 or greater and Ondansetron, Dexamethasone and Midazolam  Airway Management Planned: Oral ETT  Additional Equipment:   Intra-op Plan:   Post-operative Plan: Extubation in OR  Informed Consent: I have reviewed the patients History and Physical, chart, labs and discussed the procedure including the risks, benefits and alternatives for the proposed anesthesia with the patient or authorized representative who has indicated his/her understanding and acceptance.     Dental advisory given  Plan Discussed with: CRNA  Anesthesia Plan Comments:       Anesthesia Quick Evaluation

## 2021-03-30 NOTE — Procedures (Signed)
Interventional Radiology Procedure Note  Procedure: Uterine fibroid embolization  Complications: None  Estimated Blood Loss: < 10 mL  Findings: Bilateral uterine artery embolization performed. Left uterine artery embolized with 2 vials of 500-700 micron and 3 vials of 700-900 micron Embospheres followed by some Gelfoam pledgets. Right uterine artery embolized with 2 vials of 500-700 micron and 1 vial of 700-900 micron Embospheres followed by Gelfoam pledgets.  Plan: Admit for planned hysterectomy tomorrow. Pain control tonight via Dilaudid PCA.  Venetia Night. Kathlene Cote, M.D Pager:  [phone]

## 2021-03-31 ENCOUNTER — Encounter (HOSPITAL_COMMUNITY)
Admission: AD | Disposition: A | Discharge status: Home / Self Care | Payer: Self-pay | Source: Ambulatory Visit | Attending: Interventional Radiology

## 2021-03-31 ENCOUNTER — Encounter (HOSPITAL_COMMUNITY): Payer: Self-pay

## 2021-03-31 ENCOUNTER — Inpatient Hospital Stay (HOSPITAL_COMMUNITY): Payer: BC Managed Care – PPO | Admitting: Anesthesiology

## 2021-03-31 ENCOUNTER — Inpatient Hospital Stay (HOSPITAL_COMMUNITY)
Admission: RE | Admit: 2021-03-31 | Payer: BC Managed Care – PPO | Source: Other Acute Inpatient Hospital | Admitting: Obstetrics & Gynecology

## 2021-03-31 ENCOUNTER — Inpatient Hospital Stay (HOSPITAL_COMMUNITY): Payer: BC Managed Care – PPO | Admitting: Physician Assistant

## 2021-03-31 DIAGNOSIS — D509 Iron deficiency anemia, unspecified: Secondary | ICD-10-CM

## 2021-03-31 DIAGNOSIS — D252 Subserosal leiomyoma of uterus: Secondary | ICD-10-CM | POA: Diagnosis present

## 2021-03-31 DIAGNOSIS — N921 Excessive and frequent menstruation with irregular cycle: Secondary | ICD-10-CM | POA: Diagnosis not present

## 2021-03-31 DIAGNOSIS — K660 Peritoneal adhesions (postprocedural) (postinfection): Secondary | ICD-10-CM | POA: Diagnosis present

## 2021-03-31 DIAGNOSIS — I10 Essential (primary) hypertension: Secondary | ICD-10-CM | POA: Diagnosis present

## 2021-03-31 DIAGNOSIS — N938 Other specified abnormal uterine and vaginal bleeding: Secondary | ICD-10-CM | POA: Diagnosis present

## 2021-03-31 DIAGNOSIS — Z20822 Contact with and (suspected) exposure to covid-19: Secondary | ICD-10-CM | POA: Diagnosis present

## 2021-03-31 DIAGNOSIS — D250 Submucous leiomyoma of uterus: Secondary | ICD-10-CM | POA: Diagnosis present

## 2021-03-31 DIAGNOSIS — R011 Cardiac murmur, unspecified: Secondary | ICD-10-CM | POA: Diagnosis present

## 2021-03-31 DIAGNOSIS — D259 Leiomyoma of uterus, unspecified: Secondary | ICD-10-CM | POA: Diagnosis present

## 2021-03-31 DIAGNOSIS — Z79899 Other long term (current) drug therapy: Secondary | ICD-10-CM | POA: Diagnosis not present

## 2021-03-31 DIAGNOSIS — D472 Monoclonal gammopathy: Secondary | ICD-10-CM | POA: Diagnosis present

## 2021-03-31 DIAGNOSIS — Z888 Allergy status to other drugs, medicaments and biological substances status: Secondary | ICD-10-CM | POA: Diagnosis not present

## 2021-03-31 DIAGNOSIS — N920 Excessive and frequent menstruation with regular cycle: Secondary | ICD-10-CM | POA: Diagnosis present

## 2021-03-31 DIAGNOSIS — D251 Intramural leiomyoma of uterus: Secondary | ICD-10-CM | POA: Diagnosis present

## 2021-03-31 HISTORY — DX: Leiomyoma of uterus, unspecified: D25.9

## 2021-03-31 HISTORY — PX: HYSTERECTOMY ABDOMINAL WITH SALPINGECTOMY: SHX6725

## 2021-03-31 LAB — TYPE AND SCREEN
ABO/RH(D): A POS
Antibody Screen: NEGATIVE
Unit division: 0
Unit division: 0

## 2021-03-31 LAB — PREPARE RBC (CROSSMATCH)

## 2021-03-31 LAB — BPAM RBC
Blood Product Expiration Date: 202207262359
Blood Product Expiration Date: 202207262359
Unit Type and Rh: 6200
Unit Type and Rh: 6200

## 2021-03-31 LAB — SURGICAL PCR SCREEN
MRSA, PCR: NEGATIVE
Staphylococcus aureus: NEGATIVE

## 2021-03-31 SURGERY — HYSTERECTOMY, TOTAL, ABDOMINAL, WITH SALPINGECTOMY
Anesthesia: General

## 2021-03-31 MED ORDER — 0.9 % SODIUM CHLORIDE (POUR BTL) OPTIME
TOPICAL | Status: DC | PRN
  Administered 2021-03-31: 1000 mL

## 2021-03-31 MED ORDER — ENOXAPARIN SODIUM 40 MG/0.4ML IJ SOSY
40.0000 mg | PREFILLED_SYRINGE | INTRAMUSCULAR | Status: AC
  Administered 2021-03-31: 40 mg via SUBCUTANEOUS
  Filled 2021-03-31: qty 0.4

## 2021-03-31 MED ORDER — HYDROMORPHONE HCL 1 MG/ML IJ SOLN: 2.0000 mg | INTRAMUSCULAR | Status: DC | PRN

## 2021-03-31 MED ORDER — ONDANSETRON HCL 4 MG/2ML IJ SOLN
INTRAMUSCULAR | Status: DC | PRN
  Administered 2021-03-31: 4 mg via INTRAVENOUS

## 2021-03-31 MED ORDER — MENTHOL 3 MG MT LOZG: 1.0000 | LOZENGE | OROMUCOSAL | Status: DC | PRN

## 2021-03-31 MED ORDER — PROPOFOL 10 MG/ML IV BOLUS
INTRAVENOUS | Status: DC | PRN
  Administered 2021-03-31: 150 mg via INTRAVENOUS

## 2021-03-31 MED ORDER — OXYCODONE-ACETAMINOPHEN 5-325 MG PO TABS
1.0000 | ORAL_TABLET | ORAL | Status: DC | PRN
Start: 2021-03-31 — End: 2021-04-01
  Administered 2021-03-31 – 2021-04-01 (×4): 2 via ORAL
  Filled 2021-03-31 (×4): qty 2

## 2021-03-31 MED ORDER — GABAPENTIN 100 MG PO CAPS
100.0000 mg | ORAL_CAPSULE | Freq: Three times a day (TID) | ORAL | Status: DC
  Administered 2021-03-31 – 2021-04-01 (×3): 100 mg via ORAL
  Filled 2021-03-31 (×3): qty 1

## 2021-03-31 MED ORDER — KETOROLAC TROMETHAMINE 30 MG/ML IJ SOLN
30.0000 mg | Freq: Once | INTRAMUSCULAR | Status: AC
  Administered 2021-03-31: 30 mg via INTRAVENOUS

## 2021-03-31 MED ORDER — SUGAMMADEX SODIUM 200 MG/2ML IV SOLN
INTRAVENOUS | Status: DC | PRN
  Administered 2021-03-31: 60 mg via INTRAVENOUS
  Administered 2021-03-31 (×2): 50 mg via INTRAVENOUS

## 2021-03-31 MED ORDER — ALUM & MAG HYDROXIDE-SIMETH 200-200-20 MG/5ML PO SUSP: 30.0000 mL | ORAL | Status: DC | PRN

## 2021-03-31 MED ORDER — DOCUSATE SODIUM 100 MG PO CAPS
100.0000 mg | ORAL_CAPSULE | Freq: Two times a day (BID) | ORAL | Status: DC
  Administered 2021-04-01: 100 mg via ORAL
  Filled 2021-03-31: qty 1

## 2021-03-31 MED ORDER — MIDAZOLAM HCL 2 MG/2ML IJ SOLN
INTRAMUSCULAR | Status: DC | PRN
  Administered 2021-03-31: 2 mg via INTRAVENOUS

## 2021-03-31 MED ORDER — LIDOCAINE HCL (CARDIAC) PF 100 MG/5ML IV SOSY
PREFILLED_SYRINGE | INTRAVENOUS | Status: DC | PRN
  Administered 2021-03-31: 40 mg via INTRAVENOUS

## 2021-03-31 MED ORDER — HYDRALAZINE HCL 20 MG/ML IJ SOLN
5.0000 mg | Freq: Once | INTRAMUSCULAR | Status: AC
  Administered 2021-03-31: 5 mg via INTRAVENOUS

## 2021-03-31 MED ORDER — FENTANYL CITRATE (PF) 250 MCG/5ML IJ SOLN
INTRAMUSCULAR | Status: DC | PRN
  Administered 2021-03-31 (×9): 50 ug via INTRAVENOUS
  Administered 2021-03-31: 100 ug via INTRAVENOUS

## 2021-03-31 MED ORDER — KETAMINE HCL 10 MG/ML IJ SOLN
INTRAMUSCULAR | Status: DC | PRN
  Administered 2021-03-31: 40 mg via INTRAVENOUS

## 2021-03-31 MED ORDER — HYDROMORPHONE 1 MG/ML IV SOLN: INTRAVENOUS | Status: DC

## 2021-03-31 MED ORDER — CEFAZOLIN SODIUM-DEXTROSE 2-4 GM/100ML-% IV SOLN
INTRAVENOUS | Status: AC
  Filled 2021-03-31: qty 100

## 2021-03-31 MED ORDER — SODIUM CHLORIDE 0.9% IV SOLUTION: Freq: Once | INTRAVENOUS | Status: DC

## 2021-03-31 MED ORDER — KETOROLAC TROMETHAMINE 30 MG/ML IJ SOLN
INTRAMUSCULAR | Status: AC
  Filled 2021-03-31: qty 1

## 2021-03-31 MED ORDER — ORAL CARE MOUTH RINSE: 15.0000 mL | Freq: Once | OROMUCOSAL | Status: AC

## 2021-03-31 MED ORDER — SODIUM CHLORIDE 0.9 % IV SOLN
2.0000 g | Freq: Once | INTRAVENOUS | Status: AC
  Administered 2021-03-31: 2 g via INTRAVENOUS
  Filled 2021-03-31: qty 2

## 2021-03-31 MED ORDER — HYDROMORPHONE HCL 1 MG/ML IJ SOLN
0.2500 mg | INTRAMUSCULAR | Status: DC | PRN
  Administered 2021-03-31 (×4): 0.5 mg via INTRAVENOUS

## 2021-03-31 MED ORDER — SODIUM CHLORIDE 0.9% FLUSH: 9.0000 mL | INTRAVENOUS | Status: DC | PRN

## 2021-03-31 MED ORDER — SIMETHICONE 80 MG PO CHEW: 80.0000 mg | CHEWABLE_TABLET | Freq: Four times a day (QID) | ORAL | Status: DC | PRN

## 2021-03-31 MED ORDER — HYDROMORPHONE HCL 1 MG/ML IJ SOLN
INTRAMUSCULAR | Status: AC
  Filled 2021-03-31: qty 1

## 2021-03-31 MED ORDER — HYDRALAZINE HCL 20 MG/ML IJ SOLN
INTRAMUSCULAR | Status: AC
  Filled 2021-03-31: qty 1

## 2021-03-31 MED ORDER — IBUPROFEN 400 MG PO TABS
600.0000 mg | ORAL_TABLET | Freq: Four times a day (QID) | ORAL | Status: DC
  Administered 2021-03-31 – 2021-04-01 (×2): 600 mg via ORAL
  Filled 2021-03-31 (×2): qty 1

## 2021-03-31 MED ORDER — ROCURONIUM BROMIDE 10 MG/ML (PF) SYRINGE
PREFILLED_SYRINGE | INTRAVENOUS | Status: DC | PRN
  Administered 2021-03-31: 30 mg via INTRAVENOUS
  Administered 2021-03-31: 60 mg via INTRAVENOUS
  Administered 2021-03-31: 30 mg via INTRAVENOUS

## 2021-03-31 MED ORDER — ONDANSETRON HCL 4 MG/2ML IJ SOLN: 4.0000 mg | Freq: Four times a day (QID) | INTRAMUSCULAR | Status: DC | PRN

## 2021-03-31 MED ORDER — PANTOPRAZOLE SODIUM 40 MG IV SOLR
40.0000 mg | Freq: Every day | INTRAVENOUS | Status: DC
  Administered 2021-03-31: 40 mg via INTRAVENOUS
  Filled 2021-03-31: qty 40

## 2021-03-31 MED ORDER — ONDANSETRON HCL 4 MG PO TABS: 4.0000 mg | ORAL_TABLET | Freq: Four times a day (QID) | ORAL | Status: DC | PRN

## 2021-03-31 MED ORDER — CHLORHEXIDINE GLUCONATE 0.12 % MT SOLN
15.0000 mL | Freq: Once | OROMUCOSAL | Status: AC
  Administered 2021-03-31: 15 mL via OROMUCOSAL
  Filled 2021-03-31: qty 15

## 2021-03-31 MED ORDER — LACTATED RINGERS IV SOLN: INTRAVENOUS | Status: DC | PRN

## 2021-03-31 MED ORDER — BUPIVACAINE LIPOSOME 1.3 % IJ SUSP
INTRAMUSCULAR | Status: DC | PRN
  Administered 2021-03-31 (×2): 5 mL

## 2021-03-31 MED ORDER — SODIUM CHLORIDE 0.9 % IV SOLN
INTRAVENOUS | Status: AC
  Filled 2021-03-31: qty 30

## 2021-03-31 MED ORDER — DEXAMETHASONE SODIUM PHOSPHATE 10 MG/ML IJ SOLN
INTRAMUSCULAR | Status: DC | PRN
  Administered 2021-03-31: 5 mg via INTRAVENOUS

## 2021-03-31 MED ORDER — CHLORHEXIDINE GLUCONATE CLOTH 2 % EX PADS: 6.0000 | MEDICATED_PAD | Freq: Every day | CUTANEOUS | Status: DC

## 2021-03-31 MED ORDER — DIPHENHYDRAMINE HCL 50 MG/ML IJ SOLN: 12.5000 mg | Freq: Four times a day (QID) | INTRAMUSCULAR | Status: DC | PRN

## 2021-03-31 MED ORDER — ACETAMINOPHEN 500 MG PO TABS
1000.0000 mg | ORAL_TABLET | Freq: Once | ORAL | Status: AC
  Administered 2021-03-31: 1000 mg via ORAL
  Filled 2021-03-31: qty 2

## 2021-03-31 MED ORDER — DIPHENHYDRAMINE HCL 12.5 MG/5ML PO ELIX
12.5000 mg | ORAL_SOLUTION | Freq: Four times a day (QID) | ORAL | Status: DC | PRN
  Administered 2021-03-31: 12.5 mg via ORAL
  Filled 2021-03-31: qty 5

## 2021-03-31 MED ORDER — POVIDONE-IODINE 10 % EX SWAB
2.0000 "application " | Freq: Once | CUTANEOUS | Status: AC
  Administered 2021-03-31: 2 via TOPICAL

## 2021-03-31 MED ORDER — ENOXAPARIN SODIUM 40 MG/0.4ML IJ SOSY
40.0000 mg | PREFILLED_SYRINGE | INTRAMUSCULAR | Status: DC
  Filled 2021-03-31: qty 0.4

## 2021-03-31 MED ORDER — NALOXONE HCL 0.4 MG/ML IJ SOLN: 0.4000 mg | INTRAMUSCULAR | Status: DC | PRN

## 2021-03-31 MED ORDER — BUPIVACAINE-EPINEPHRINE (PF) 0.5% -1:200000 IJ SOLN
INTRAMUSCULAR | Status: DC | PRN
  Administered 2021-03-31 (×2): 20 mL

## 2021-03-31 MED ORDER — CEFAZOLIN SODIUM-DEXTROSE 2-3 GM-%(50ML) IV SOLR
INTRAVENOUS | Status: DC | PRN
  Administered 2021-03-31: 2 g via INTRAVENOUS

## 2021-03-31 MED ORDER — HYDROMORPHONE HCL 1 MG/ML IJ SOLN
1.0000 mg | INTRAMUSCULAR | Status: DC | PRN
  Filled 2021-03-31: qty 1

## 2021-03-31 MED ORDER — DEXTROSE-NACL 5-0.45 % IV SOLN: INTRAVENOUS | Status: DC

## 2021-03-31 MED ORDER — LACTATED RINGERS IV SOLN: INTRAVENOUS | Status: DC

## 2021-03-31 MED ORDER — KETOROLAC TROMETHAMINE 30 MG/ML IJ SOLN
30.0000 mg | Freq: Four times a day (QID) | INTRAMUSCULAR | Status: AC
  Administered 2021-03-31 – 2021-04-01 (×4): 30 mg via INTRAVENOUS
  Filled 2021-03-31 (×4): qty 1

## 2021-03-31 SURGICAL SUPPLY — 41 items
APL PRP STRL LF DISP 70% ISPRP (MISCELLANEOUS) ×2
APL SKNCLS STERI-STRIP NONHPOA (GAUZE/BANDAGES/DRESSINGS) ×2
BAG COUNTER SPONGE SURGICOUNT (BAG) ×1 IMPLANT
BAG SPNG CNTER NS LX DISP (BAG) ×2
BAG SURGICOUNT SPONGE COUNTING (BAG) ×1
BENZOIN TINCTURE PRP APPL 2/3 (GAUZE/BANDAGES/DRESSINGS) ×2 IMPLANT
CHLORAPREP W/TINT 26 (MISCELLANEOUS) ×4 IMPLANT
CLOSURE WOUND 1/2 X4 (GAUZE/BANDAGES/DRESSINGS) ×1
COUNTER NEEDLE 20 DBL MAG RED (NEEDLE) ×4 IMPLANT
DRAPE WARM FLUID 44X44 (DRAPES) ×2 IMPLANT
DRSG OPSITE POSTOP 4X12 (GAUZE/BANDAGES/DRESSINGS) ×2 IMPLANT
ELECT BLADE TIP CTD 4 INCH (ELECTRODE) ×2 IMPLANT
GAUZE 4X4 16PLY ~~LOC~~+RFID DBL (SPONGE) ×2 IMPLANT
GLOVE SURG ENC MOIS LTX SZ6 (GLOVE) ×4 IMPLANT
GLOVE SURG ENC MOIS LTX SZ6.5 (GLOVE) ×2 IMPLANT
GLOVE SURG LTX SZ6.5 (GLOVE) ×4 IMPLANT
GLOVE SURG UNDER POLY LF SZ7 (GLOVE) ×12 IMPLANT
GOWN STRL REUS W/ TWL LRG LVL3 (GOWN DISPOSABLE) IMPLANT
GOWN STRL REUS W/TWL LRG LVL3 (GOWN DISPOSABLE) ×16
KIT TURNOVER KIT A (KITS) ×4 IMPLANT
LIGASURE IMPACT 36 18CM CVD LR (INSTRUMENTS) ×2 IMPLANT
NS IRRIG 1000ML POUR BTL (IV SOLUTION) ×6 IMPLANT
PACK ABDOMINAL GYN (CUSTOM PROCEDURE TRAY) ×4 IMPLANT
PAD OB MATERNITY 4.3X12.25 (PERSONAL CARE ITEMS) ×4 IMPLANT
SHEET LAVH (DRAPES) ×4 IMPLANT
SPONGE T-LAP 18X18 ~~LOC~~+RFID (SPONGE) ×2 IMPLANT
STRIP CLOSURE SKIN 1/2X4 (GAUZE/BANDAGES/DRESSINGS) ×1 IMPLANT
SUT PDS AB 0 CTX 60 (SUTURE) ×2 IMPLANT
SUT PLAIN 2 0 XLH (SUTURE) ×4 IMPLANT
SUT VIC AB 0 CT1 18XCR BRD8 (SUTURE) ×6 IMPLANT
SUT VIC AB 0 CT1 27 (SUTURE) ×32
SUT VIC AB 0 CT1 27XBRD ANBCTR (SUTURE) ×4 IMPLANT
SUT VIC AB 0 CT1 8-18 (SUTURE) ×12
SUT VIC AB 2-0 CT1 18 (SUTURE) ×2 IMPLANT
SUT VIC AB 2-0 CT1 27 (SUTURE) ×44
SUT VIC AB 2-0 CT1 TAPERPNT 27 (SUTURE) ×2 IMPLANT
SUT VIC AB 3-0 PS2 18 (SUTURE) ×8
SUT VIC AB 3-0 PS2 18XBRD (SUTURE) ×2 IMPLANT
SUT VICRYL 0 TIES 12 18 (SUTURE) ×4 IMPLANT
TOWEL OR 17X26 10 PK STRL BLUE (TOWEL DISPOSABLE) ×6 IMPLANT
TRAY FOLEY MTR SLVR 14FR STAT (SET/KITS/TRAYS/PACK) ×4 IMPLANT

## 2021-03-31 NOTE — Anesthesia Procedure Notes (Addendum)
Procedure Name: Intubation Date/Time: 03/31/2021 7:47 AM Performed by: Raenette Rover, CRNA Pre-anesthesia Checklist: Patient identified, Emergency Drugs available, Suction available, Patient being monitored and Timeout performed Patient Re-evaluated:Patient Re-evaluated prior to induction Oxygen Delivery Method: Circle system utilized Preoxygenation: Pre-oxygenation with 100% oxygen Induction Type: IV induction Ventilation: Mask ventilation without difficulty Laryngoscope Size: Mac and 3 Grade View: Grade I Tube type: Oral Tube size: 7.0 mm Number of attempts: 1 Airway Equipment and Method: Stylet Placement Confirmation: ETT inserted through vocal cords under direct vision, positive ETCO2 and breath sounds checked- equal and bilateral Secured at: 21 cm Tube secured with: Tape Dental Injury: Teeth and Oropharynx as per pre-operative assessment  Comments: DL x1 by [place]

## 2021-03-31 NOTE — H&P (Signed)
Tanya Murray is an 45 y.o. female G41 MAAF here for definitive treatment of hysterectomy/bilateral salpingectomy due to significantly enlarged uterus and hx of significant anemia.  She has undergone MRI and endometrial biopsy.  She is on oral progesterone and TXA.  Hb yesterday was 12.4.  it was 4.9 in march when she initially presented to the ER.  She did have [place] yesterday to help decrease bleeding perioperatively.  We have reviewed risks/benefits.  She is aware I do not feel she has any alteratives to this for treatment due to size of uterus.  Pertinent Gynecological History: Menses:  heavy but improved with megace Bleeding: dysfunctional uterine bleeding Contraception:  none needed due to female/female relationship DES exposure: denies Blood transfusions:  yes in ER in 11/2020 Sexually transmitted diseases: trich treated with negative follow up testing Previous GYN Procedures:  none   Last mammogram: has not done yet Last pap: normal Date: 12/2020 OB History: G0, P0     Past Medical History:  Diagnosis Date   Adopted    Anemia    Heart murmur    Hypertension     Past Surgical History:  Procedure Laterality Date   IR ANGIOGRAM PELVIS SELECTIVE OR SUPRASELECTIVE  03/30/2021   IR ANGIOGRAM SELECTIVE EACH ADDITIONAL VESSEL  03/30/2021   IR ANGIOGRAM SELECTIVE EACH ADDITIONAL VESSEL  03/30/2021   IR EMBO TUMOR ORGAN ISCHEMIA INFARCT INC GUIDE ROADMAPPING  03/30/2021   IR RADIOLOGIST EVAL & MGMT  02/10/2021   IR US GUIDE VASC ACCESS RIGHT  03/30/2021    Family History  Adopted: Yes    Social History:  reports that she has never smoked. She has never used smokeless tobacco. She reports current alcohol use. She reports that she does not use drugs.  Allergies:  Allergies  Allergen Reactions   Tape Rash    Medications Prior to Admission  Medication Sig Dispense Refill Last Dose   FeFum-FePoly-FA-B Cmp-C-Biot (INTEGRA PLUS) CAPS Take 1 capsule by mouth every morning. 30 capsule 2  Past Month   hydrochlorothiazide (HYDRODIURIL) 25 MG tablet TAKE 1 TABLET(25 MG) BY MOUTH DAILY (Patient taking differently: Take 25 mg by mouth daily.) 30 tablet 1 03/29/2021 at am   HYDROcodone-acetaminophen (NORCO/VICODIN) 5-325 MG tablet Take 1-2 tablets by mouth every 6 (six) hours as needed for moderate pain. 20 tablet 0 03/29/2021   ondansetron (ZOFRAN) 4 MG tablet Take 1 tablet (4 mg total) by mouth every 6 (six) hours as needed for nausea. 20 tablet 0    tranexamic acid (LYSTEDA) 650 MG TABS tablet Take 2 tablets (1,300 mg total) by mouth 3 (three) times daily. (Patient taking differently: Take 1,300 mg by mouth 3 (three) times daily as needed (at the beginning of menstrual cycle).) 30 tablet 1 2weeks   megestrol (MEGACE) 40 MG tablet Take 1 tablet (40 mg total) by mouth daily. (Patient not taking: Reported on 03/30/2021) 90 tablet 1 Completed Course    Review of Systems  Constitutional: Negative.   Gastrointestinal:  Positive for abdominal distention.  Genitourinary:  Positive for menstrual problem.  Psychiatric/Behavioral: Negative.     Blood pressure (!) 147/89, pulse 91, temperature 98.4 F (36.9 C), temperature source Oral, resp. rate 16, height 5\' 9"  (1.753 m), weight 80.3 kg, SpO2 100 %. Physical Exam Constitutional:      Appearance: Normal appearance.  Cardiovascular:     Rate and Rhythm: Normal rate and regular rhythm.  Pulmonary:     Effort: Pulmonary effort is normal.     Breath  sounds: Normal breath sounds.  Abdominal:     General: There is distension (grossly enlarged uterus).  Neurological:     General: No focal deficit present.     Mental Status: She is alert.  Psychiatric:        Mood and Affect: Mood normal.    Results for orders placed or performed during the hospital encounter of 03/30/21 (from the past 24 hour(s))  Protime-INR     Status: None   Collection Time: 03/30/21 11:55 AM  Result Value Ref Range   Prothrombin Time 12.4 11.4 - 15.2 seconds   INR  0.9 0.8 - 1.2  CBC with Differential/Platelet     Status: None   Collection Time: 03/30/21 11:55 AM  Result Value Ref Range   WBC 6.0 4.0 - 10.5 K/uL   RBC 4.32 3.87 - 5.11 MIL/uL   Hemoglobin 12.4 12.0 - 15.0 g/dL   HCT 38.9 36.0 - 46.0 %   MCV 90.0 80.0 - 100.0 fL   MCH 28.7 26.0 - 34.0 pg   MCHC 31.9 30.0 - 36.0 g/dL   RDW 14.3 11.5 - 15.5 %   Platelets 278 150 - 400 K/uL   nRBC 0.0 0.0 - 0.2 %   Neutrophils Relative % 62 %   Neutro Abs 3.8 1.7 - 7.7 K/uL   Lymphocytes Relative 25 %   Lymphs Abs 1.5 0.7 - 4.0 K/uL   Monocytes Relative 8 %   Monocytes Absolute 0.5 0.1 - 1.0 K/uL   Eosinophils Relative 4 %   Eosinophils Absolute 0.2 0.0 - 0.5 K/uL   Basophils Relative 1 %   Basophils Absolute 0.0 0.0 - 0.1 K/uL   Immature Granulocytes 0 %   Abs Immature Granulocytes 0.01 0.00 - 0.07 K/uL  Basic metabolic panel     Status: Abnormal   Collection Time: 03/30/21 11:55 AM  Result Value Ref Range   Sodium 133 (L) 135 - 145 mmol/L   Potassium 3.6 3.5 - 5.1 mmol/L   Chloride 98 98 - 111 mmol/L   CO2 28 22 - 32 mmol/L   Glucose, Bld 95 70 - 99 mg/dL   BUN 11 6 - 20 mg/dL   Creatinine, Ser 0.64 0.44 - 1.00 mg/dL   Calcium 9.9 8.9 - 10.3 mg/dL   GFR, Estimated >60 >60 mL/min   Anion gap 7 5 - 15  hCG, serum, qualitative     Status: None   Collection Time: 03/30/21 11:55 AM  Result Value Ref Range   Preg, Serum NEGATIVE NEGATIVE  Surgical pcr screen     Status: None   Collection Time: 03/30/21  7:49 PM   Specimen: Nasal Mucosa; Nasal Swab  Result Value Ref Range   MRSA, PCR NEGATIVE NEGATIVE   Staphylococcus aureus NEGATIVE NEGATIVE    IR Angiogram Pelvis Selective Or Supraselective  Result Date: 03/30/2021 CLINICAL DATA:  Massively enlarged uterus secondary to multiple uterine fibroids. The patient is scheduled for hysterectomy tomorrow and has been scheduled for a preoperative uterine artery embolization procedure due to the unusually massive size of her uterus and high  risk for significant blood loss. EXAM: 1. ULTRASOUND GUIDANCE FOR VASCULAR ACCESS OF THE RIGHT COMMON FEMORAL ARTERY 2. SELECTIVE PELVIC ARTERIOGRAPHY OF THE LEFT INTERNAL ILIAC ARTERY 3. SELECTIVE ARTERIOGRAPHY OF THE LEFT UTERINE ARTERY 4. SELECTIVE PELVIC ARTERIOGRAPHY OF THE RIGHT INTERNAL ILIAC ARTERY 5. SELECTIVE ARTERIOGRAPHY OF THE RIGHT UTERINE ARTERY 6. TRANSCATHETER EMBOLIZATION OF BILATERAL UTERINE ARTERIES ANESTHESIA/SEDATION: Moderate (conscious) sedation was employed during this procedure.  A total of Versed 6.0 mg and Fentanyl 250 mcg was administered intravenously. Moderate Sedation Time: 117 minutes. The patient's level of consciousness and vital signs were monitored continuously by radiology nursing throughout the procedure under my direct supervision. MEDICATIONS: 2 g IV Ancef, 30 mg IV Toradol. FLUOROSCOPY TIME:  34 minutes and 54 seconds.  1798 mGy. PROCEDURE: The procedure, risks, benefits, and alternatives were explained to the patient. Questions regarding the procedure were encouraged and answered. The patient understands and consents to the procedure. A time-out was performed prior to initiating the procedure. The right groin was prepped with chlorhexidine in a sterile fashion, and a sterile drape was applied covering the operative field. A sterile gown and sterile gloves were used for the procedure. Local anesthesia was provided with 1% Lidocaine. Ultrasound was used to confirm patency of the right common femoral artery. After small skin incision, a 21 gauge needle was advanced into the right common femoral artery under direct ultrasound guidance. Ultrasound image documentation was performed. After establishing guide wire access, a 5-French sheath was placed. A 5 Fr diagnostic catheter was advanced over a guidewire into the distal abdominal aorta. The catheter was then used to selectively catheterize the left common iliac artery followed by the left internal iliac artery. Selective pelvic  arteriography was performed at the level of the left internal iliac artery. A 2.8 Fr coaxial microcatheter was then introduced through the diagnostic catheter and used to selectively catheterize the left uterine artery. Selective arteriography of the left uterine artery was performed through the microcatheter. Left uterine artery embolization was then performed with installation of microsphere particles. Follow-up arteriography was performed after embolization. The microcatheter was removed. The diagnostic catheter was then retracted and used to selectively catheterize the right internal iliac artery. Selective pelvic arteriography of the right internal iliac artery was performed. The microcatheter was then reintroduced and used to selectively catheterize the right uterine artery. Selective arteriography of the right uterine artery was then performed through the microcatheter. Right uterine artery embolization was then performed with installation of microsphere particles. Follow-up arteriography was performed after embolization. Right femoral arteriotomy hemostasis: Celt device COMPLICATIONS: None. FINDINGS: Arteriography at the level of the left internal iliac artery demonstrates an enlarged left uterine artery supplying a large number of hypervascular branch vessels to lower uterine and left-sided fibroids. Selective uterine arteriography demonstrates prominent hypervascular branches and rapid flow. Left uterine artery embolization was performed utilizing 2 vials of 500-700 micron sized and 3 vials of 700-900 micron sized Embosphere particles. Additional Gel-Foam pledgets were also injected in diluted contrast via the microcatheter. Completion arteriography demonstrates slower flow and adequate occlusion of branches supplying uterine fibroids. Right internal iliac arteriography demonstrates an enlarged right uterine artery supplying predominantly superior right-sided fibroid tissue. Right uterine artery  embolization was performed utilizing 2 vials of 500-700 micron sized and 1 vial of 700-900 micron sized Embosphere particles. Additional Gel-Foam pledgets were injected in diluted contrast via the microcatheter. Completion arteriography demonstrates slower flow and adequate occlusion of branches supplying uterine fibroids. Adequate hemostasis was achieved at the femoral arteriotomy site. IMPRESSION: Successful bilateral uterine artery embolization with microsphere particles as well as Gel-Foam pledgets bilaterally for preoperative embolization prior to planned surgical hysterectomy tomorrow. Diminished flow in branches supplying uterine fibroids was accomplished. The patient will be admitted for observation prior to surgery tomorrow. Electronically Signed   By: Aletta Edouard M.D.   On: 03/30/2021 16:36   IR Angiogram Selective Each Additional Vessel  Result Date: 03/30/2021 CLINICAL DATA:  Massively enlarged uterus secondary to multiple uterine fibroids. The patient is scheduled for hysterectomy tomorrow and has been scheduled for a preoperative uterine artery embolization procedure due to the unusually massive size of her uterus and high risk for significant blood loss. EXAM: 1. ULTRASOUND GUIDANCE FOR VASCULAR ACCESS OF THE RIGHT COMMON FEMORAL ARTERY 2. SELECTIVE PELVIC ARTERIOGRAPHY OF THE LEFT INTERNAL ILIAC ARTERY 3. SELECTIVE ARTERIOGRAPHY OF THE LEFT UTERINE ARTERY 4. SELECTIVE PELVIC ARTERIOGRAPHY OF THE RIGHT INTERNAL ILIAC ARTERY 5. SELECTIVE ARTERIOGRAPHY OF THE RIGHT UTERINE ARTERY 6. TRANSCATHETER EMBOLIZATION OF BILATERAL UTERINE ARTERIES ANESTHESIA/SEDATION: Moderate (conscious) sedation was employed during this procedure. A total of Versed 6.0 mg and Fentanyl 250 mcg was administered intravenously. Moderate Sedation Time: 117 minutes. The patient's level of consciousness and vital signs were monitored continuously by radiology nursing throughout the procedure under my direct supervision.  MEDICATIONS: 2 g IV Ancef, 30 mg IV Toradol. FLUOROSCOPY TIME:  34 minutes and 54 seconds.  1798 mGy. PROCEDURE: The procedure, risks, benefits, and alternatives were explained to the patient. Questions regarding the procedure were encouraged and answered. The patient understands and consents to the procedure. A time-out was performed prior to initiating the procedure. The right groin was prepped with chlorhexidine in a sterile fashion, and a sterile drape was applied covering the operative field. A sterile gown and sterile gloves were used for the procedure. Local anesthesia was provided with 1% Lidocaine. Ultrasound was used to confirm patency of the right common femoral artery. After small skin incision, a 21 gauge needle was advanced into the right common femoral artery under direct ultrasound guidance. Ultrasound image documentation was performed. After establishing guide wire access, a 5-French sheath was placed. A 5 Fr diagnostic catheter was advanced over a guidewire into the distal abdominal aorta. The catheter was then used to selectively catheterize the left common iliac artery followed by the left internal iliac artery. Selective pelvic arteriography was performed at the level of the left internal iliac artery. A 2.8 Fr coaxial microcatheter was then introduced through the diagnostic catheter and used to selectively catheterize the left uterine artery. Selective arteriography of the left uterine artery was performed through the microcatheter. Left uterine artery embolization was then performed with installation of microsphere particles. Follow-up arteriography was performed after embolization. The microcatheter was removed. The diagnostic catheter was then retracted and used to selectively catheterize the right internal iliac artery. Selective pelvic arteriography of the right internal iliac artery was performed. The microcatheter was then reintroduced and used to selectively catheterize the right  uterine artery. Selective arteriography of the right uterine artery was then performed through the microcatheter. Right uterine artery embolization was then performed with installation of microsphere particles. Follow-up arteriography was performed after embolization. Right femoral arteriotomy hemostasis: Celt device COMPLICATIONS: None. FINDINGS: Arteriography at the level of the left internal iliac artery demonstrates an enlarged left uterine artery supplying a large number of hypervascular branch vessels to lower uterine and left-sided fibroids. Selective uterine arteriography demonstrates prominent hypervascular branches and rapid flow. Left uterine artery embolization was performed utilizing 2 vials of 500-700 micron sized and 3 vials of 700-900 micron sized Embosphere particles. Additional Gel-Foam pledgets were also injected in diluted contrast via the microcatheter. Completion arteriography demonstrates slower flow and adequate occlusion of branches supplying uterine fibroids. Right internal iliac arteriography demonstrates an enlarged right uterine artery supplying predominantly superior right-sided fibroid tissue. Right uterine artery embolization was performed utilizing 2 vials of 500-700 micron sized and 1 vial of 700-900 micron sized Embosphere  particles. Additional Gel-Foam pledgets were injected in diluted contrast via the microcatheter. Completion arteriography demonstrates slower flow and adequate occlusion of branches supplying uterine fibroids. Adequate hemostasis was achieved at the femoral arteriotomy site. IMPRESSION: Successful bilateral uterine artery embolization with microsphere particles as well as Gel-Foam pledgets bilaterally for preoperative embolization prior to planned surgical hysterectomy tomorrow. Diminished flow in branches supplying uterine fibroids was accomplished. The patient will be admitted for observation prior to surgery tomorrow. Electronically Signed   By: Aletta Edouard M.D.   On: 03/30/2021 16:36   IR Angiogram Selective Each Additional Vessel  Result Date: 03/30/2021 CLINICAL DATA:  Massively enlarged uterus secondary to multiple uterine fibroids. The patient is scheduled for hysterectomy tomorrow and has been scheduled for a preoperative uterine artery embolization procedure due to the unusually massive size of her uterus and high risk for significant blood loss. EXAM: 1. ULTRASOUND GUIDANCE FOR VASCULAR ACCESS OF THE RIGHT COMMON FEMORAL ARTERY 2. SELECTIVE PELVIC ARTERIOGRAPHY OF THE LEFT INTERNAL ILIAC ARTERY 3. SELECTIVE ARTERIOGRAPHY OF THE LEFT UTERINE ARTERY 4. SELECTIVE PELVIC ARTERIOGRAPHY OF THE RIGHT INTERNAL ILIAC ARTERY 5. SELECTIVE ARTERIOGRAPHY OF THE RIGHT UTERINE ARTERY 6. TRANSCATHETER EMBOLIZATION OF BILATERAL UTERINE ARTERIES ANESTHESIA/SEDATION: Moderate (conscious) sedation was employed during this procedure. A total of Versed 6.0 mg and Fentanyl 250 mcg was administered intravenously. Moderate Sedation Time: 117 minutes. The patient's level of consciousness and vital signs were monitored continuously by radiology nursing throughout the procedure under my direct supervision. MEDICATIONS: 2 g IV Ancef, 30 mg IV Toradol. FLUOROSCOPY TIME:  34 minutes and 54 seconds.  1798 mGy. PROCEDURE: The procedure, risks, benefits, and alternatives were explained to the patient. Questions regarding the procedure were encouraged and answered. The patient understands and consents to the procedure. A time-out was performed prior to initiating the procedure. The right groin was prepped with chlorhexidine in a sterile fashion, and a sterile drape was applied covering the operative field. A sterile gown and sterile gloves were used for the procedure. Local anesthesia was provided with 1% Lidocaine. Ultrasound was used to confirm patency of the right common femoral artery. After small skin incision, a 21 gauge needle was advanced into the right common femoral artery  under direct ultrasound guidance. Ultrasound image documentation was performed. After establishing guide wire access, a 5-French sheath was placed. A 5 Fr diagnostic catheter was advanced over a guidewire into the distal abdominal aorta. The catheter was then used to selectively catheterize the left common iliac artery followed by the left internal iliac artery. Selective pelvic arteriography was performed at the level of the left internal iliac artery. A 2.8 Fr coaxial microcatheter was then introduced through the diagnostic catheter and used to selectively catheterize the left uterine artery. Selective arteriography of the left uterine artery was performed through the microcatheter. Left uterine artery embolization was then performed with installation of microsphere particles. Follow-up arteriography was performed after embolization. The microcatheter was removed. The diagnostic catheter was then retracted and used to selectively catheterize the right internal iliac artery. Selective pelvic arteriography of the right internal iliac artery was performed. The microcatheter was then reintroduced and used to selectively catheterize the right uterine artery. Selective arteriography of the right uterine artery was then performed through the microcatheter. Right uterine artery embolization was then performed with installation of microsphere particles. Follow-up arteriography was performed after embolization. Right femoral arteriotomy hemostasis: Celt device COMPLICATIONS: None. FINDINGS: Arteriography at the level of the left internal iliac artery demonstrates an enlarged left uterine artery supplying  a large number of hypervascular branch vessels to lower uterine and left-sided fibroids. Selective uterine arteriography demonstrates prominent hypervascular branches and rapid flow. Left uterine artery embolization was performed utilizing 2 vials of 500-700 micron sized and 3 vials of 700-900 micron sized Embosphere  particles. Additional Gel-Foam pledgets were also injected in diluted contrast via the microcatheter. Completion arteriography demonstrates slower flow and adequate occlusion of branches supplying uterine fibroids. Right internal iliac arteriography demonstrates an enlarged right uterine artery supplying predominantly superior right-sided fibroid tissue. Right uterine artery embolization was performed utilizing 2 vials of 500-700 micron sized and 1 vial of 700-900 micron sized Embosphere particles. Additional Gel-Foam pledgets were injected in diluted contrast via the microcatheter. Completion arteriography demonstrates slower flow and adequate occlusion of branches supplying uterine fibroids. Adequate hemostasis was achieved at the femoral arteriotomy site. IMPRESSION: Successful bilateral uterine artery embolization with microsphere particles as well as Gel-Foam pledgets bilaterally for preoperative embolization prior to planned surgical hysterectomy tomorrow. Diminished flow in branches supplying uterine fibroids was accomplished. The patient will be admitted for observation prior to surgery tomorrow. Electronically Signed   By: Aletta Edouard M.D.   On: 03/30/2021 16:36   IR US Guide Vasc Access Right  Result Date: 03/30/2021 CLINICAL DATA:  Massively enlarged uterus secondary to multiple uterine fibroids. The patient is scheduled for hysterectomy tomorrow and has been scheduled for a preoperative uterine artery embolization procedure due to the unusually massive size of her uterus and high risk for significant blood loss. EXAM: 1. ULTRASOUND GUIDANCE FOR VASCULAR ACCESS OF THE RIGHT COMMON FEMORAL ARTERY 2. SELECTIVE PELVIC ARTERIOGRAPHY OF THE LEFT INTERNAL ILIAC ARTERY 3. SELECTIVE ARTERIOGRAPHY OF THE LEFT UTERINE ARTERY 4. SELECTIVE PELVIC ARTERIOGRAPHY OF THE RIGHT INTERNAL ILIAC ARTERY 5. SELECTIVE ARTERIOGRAPHY OF THE RIGHT UTERINE ARTERY 6. TRANSCATHETER EMBOLIZATION OF BILATERAL UTERINE ARTERIES  ANESTHESIA/SEDATION: Moderate (conscious) sedation was employed during this procedure. A total of Versed 6.0 mg and Fentanyl 250 mcg was administered intravenously. Moderate Sedation Time: 117 minutes. The patient's level of consciousness and vital signs were monitored continuously by radiology nursing throughout the procedure under my direct supervision. MEDICATIONS: 2 g IV Ancef, 30 mg IV Toradol. FLUOROSCOPY TIME:  34 minutes and 54 seconds.  1798 mGy. PROCEDURE: The procedure, risks, benefits, and alternatives were explained to the patient. Questions regarding the procedure were encouraged and answered. The patient understands and consents to the procedure. A time-out was performed prior to initiating the procedure. The right groin was prepped with chlorhexidine in a sterile fashion, and a sterile drape was applied covering the operative field. A sterile gown and sterile gloves were used for the procedure. Local anesthesia was provided with 1% Lidocaine. Ultrasound was used to confirm patency of the right common femoral artery. After small skin incision, a 21 gauge needle was advanced into the right common femoral artery under direct ultrasound guidance. Ultrasound image documentation was performed. After establishing guide wire access, a 5-French sheath was placed. A 5 Fr diagnostic catheter was advanced over a guidewire into the distal abdominal aorta. The catheter was then used to selectively catheterize the left common iliac artery followed by the left internal iliac artery. Selective pelvic arteriography was performed at the level of the left internal iliac artery. A 2.8 Fr coaxial microcatheter was then introduced through the diagnostic catheter and used to selectively catheterize the left uterine artery. Selective arteriography of the left uterine artery was performed through the microcatheter. Left uterine artery embolization was then performed with installation of microsphere particles. Follow-up  arteriography was performed after embolization. The microcatheter was removed. The diagnostic catheter was then retracted and used to selectively catheterize the right internal iliac artery. Selective pelvic arteriography of the right internal iliac artery was performed. The microcatheter was then reintroduced and used to selectively catheterize the right uterine artery. Selective arteriography of the right uterine artery was then performed through the microcatheter. Right uterine artery embolization was then performed with installation of microsphere particles. Follow-up arteriography was performed after embolization. Right femoral arteriotomy hemostasis: Celt device COMPLICATIONS: None. FINDINGS: Arteriography at the level of the left internal iliac artery demonstrates an enlarged left uterine artery supplying a large number of hypervascular branch vessels to lower uterine and left-sided fibroids. Selective uterine arteriography demonstrates prominent hypervascular branches and rapid flow. Left uterine artery embolization was performed utilizing 2 vials of 500-700 micron sized and 3 vials of 700-900 micron sized Embosphere particles. Additional Gel-Foam pledgets were also injected in diluted contrast via the microcatheter. Completion arteriography demonstrates slower flow and adequate occlusion of branches supplying uterine fibroids. Right internal iliac arteriography demonstrates an enlarged right uterine artery supplying predominantly superior right-sided fibroid tissue. Right uterine artery embolization was performed utilizing 2 vials of 500-700 micron sized and 1 vial of 700-900 micron sized Embosphere particles. Additional Gel-Foam pledgets were injected in diluted contrast via the microcatheter. Completion arteriography demonstrates slower flow and adequate occlusion of branches supplying uterine fibroids. Adequate hemostasis was achieved at the femoral arteriotomy site. IMPRESSION: Successful bilateral  uterine artery embolization with microsphere particles as well as Gel-Foam pledgets bilaterally for preoperative embolization prior to planned surgical hysterectomy tomorrow. Diminished flow in branches supplying uterine fibroids was accomplished. The patient will be admitted for observation prior to surgery tomorrow. Electronically Signed   By: Aletta Edouard M.D.   On: 03/30/2021 16:36   IR EMBO TUMOR ORGAN ISCHEMIA INFARCT INC GUIDE ROADMAPPING  Result Date: 03/30/2021 CLINICAL DATA:  Massively enlarged uterus secondary to multiple uterine fibroids. The patient is scheduled for hysterectomy tomorrow and has been scheduled for a preoperative uterine artery embolization procedure due to the unusually massive size of her uterus and high risk for significant blood loss. EXAM: 1. ULTRASOUND GUIDANCE FOR VASCULAR ACCESS OF THE RIGHT COMMON FEMORAL ARTERY 2. SELECTIVE PELVIC ARTERIOGRAPHY OF THE LEFT INTERNAL ILIAC ARTERY 3. SELECTIVE ARTERIOGRAPHY OF THE LEFT UTERINE ARTERY 4. SELECTIVE PELVIC ARTERIOGRAPHY OF THE RIGHT INTERNAL ILIAC ARTERY 5. SELECTIVE ARTERIOGRAPHY OF THE RIGHT UTERINE ARTERY 6. TRANSCATHETER EMBOLIZATION OF BILATERAL UTERINE ARTERIES ANESTHESIA/SEDATION: Moderate (conscious) sedation was employed during this procedure. A total of Versed 6.0 mg and Fentanyl 250 mcg was administered intravenously. Moderate Sedation Time: 117 minutes. The patient's level of consciousness and vital signs were monitored continuously by radiology nursing throughout the procedure under my direct supervision. MEDICATIONS: 2 g IV Ancef, 30 mg IV Toradol. FLUOROSCOPY TIME:  34 minutes and 54 seconds.  1798 mGy. PROCEDURE: The procedure, risks, benefits, and alternatives were explained to the patient. Questions regarding the procedure were encouraged and answered. The patient understands and consents to the procedure. A time-out was performed prior to initiating the procedure. The right groin was prepped with  chlorhexidine in a sterile fashion, and a sterile drape was applied covering the operative field. A sterile gown and sterile gloves were used for the procedure. Local anesthesia was provided with 1% Lidocaine. Ultrasound was used to confirm patency of the right common femoral artery. After small skin incision, a 21 gauge needle was advanced into the right common femoral artery under direct ultrasound guidance.  Ultrasound image documentation was performed. After establishing guide wire access, a 5-French sheath was placed. A 5 Fr diagnostic catheter was advanced over a guidewire into the distal abdominal aorta. The catheter was then used to selectively catheterize the left common iliac artery followed by the left internal iliac artery. Selective pelvic arteriography was performed at the level of the left internal iliac artery. A 2.8 Fr coaxial microcatheter was then introduced through the diagnostic catheter and used to selectively catheterize the left uterine artery. Selective arteriography of the left uterine artery was performed through the microcatheter. Left uterine artery embolization was then performed with installation of microsphere particles. Follow-up arteriography was performed after embolization. The microcatheter was removed. The diagnostic catheter was then retracted and used to selectively catheterize the right internal iliac artery. Selective pelvic arteriography of the right internal iliac artery was performed. The microcatheter was then reintroduced and used to selectively catheterize the right uterine artery. Selective arteriography of the right uterine artery was then performed through the microcatheter. Right uterine artery embolization was then performed with installation of microsphere particles. Follow-up arteriography was performed after embolization. Right femoral arteriotomy hemostasis: Celt device COMPLICATIONS: None. FINDINGS: Arteriography at the level of the left internal iliac artery  demonstrates an enlarged left uterine artery supplying a large number of hypervascular branch vessels to lower uterine and left-sided fibroids. Selective uterine arteriography demonstrates prominent hypervascular branches and rapid flow. Left uterine artery embolization was performed utilizing 2 vials of 500-700 micron sized and 3 vials of 700-900 micron sized Embosphere particles. Additional Gel-Foam pledgets were also injected in diluted contrast via the microcatheter. Completion arteriography demonstrates slower flow and adequate occlusion of branches supplying uterine fibroids. Right internal iliac arteriography demonstrates an enlarged right uterine artery supplying predominantly superior right-sided fibroid tissue. Right uterine artery embolization was performed utilizing 2 vials of 500-700 micron sized and 1 vial of 700-900 micron sized Embosphere particles. Additional Gel-Foam pledgets were injected in diluted contrast via the microcatheter. Completion arteriography demonstrates slower flow and adequate occlusion of branches supplying uterine fibroids. Adequate hemostasis was achieved at the femoral arteriotomy site. IMPRESSION: Successful bilateral uterine artery embolization with microsphere particles as well as Gel-Foam pledgets bilaterally for preoperative embolization prior to planned surgical hysterectomy tomorrow. Diminished flow in branches supplying uterine fibroids was accomplished. The patient will be admitted for observation prior to surgery tomorrow. Electronically Signed   By: Aletta Edouard M.D.   On: 03/30/2021 16:36    Assessment/Plan: 45 yo G0 MAAF here for TAH/bilateral salpingectomy here for TAH/bilateral salpingectomy due to grossly enlarged uterus with fibroid uterus.  Questions answered.  Pt here and ready to proceed.  Megan Salon 03/31/2021, 7:09 AM

## 2021-03-31 NOTE — Anesthesia Postprocedure Evaluation (Signed)
Anesthesia Post Note  Patient: Syrenity D Nida  Procedure(s) Performed: HYSTERECTOMY ABDOMINAL WITH SALPINGECTOMY (Bilateral) APPLICATION OF CELL SAVER     Patient location during evaluation: PACU Anesthesia Type: General and Regional Level of consciousness: awake and alert Pain management: pain level controlled Vital Signs Assessment: post-procedure vital signs reviewed and stable Respiratory status: spontaneous breathing, nonlabored ventilation, respiratory function stable and patient connected to nasal cannula oxygen Cardiovascular status: blood pressure returned to baseline and stable Postop Assessment: no apparent nausea or vomiting Anesthetic complications: no   No notable events documented.  Last Vitals:  Vitals:   03/31/21 1347 03/31/21 1443  BP: (!) 166/86 (!) 157/85  Pulse: 77 80  Resp: 17 18  Temp: (!) 36.4 C (!) 35.6 C  SpO2: 100% 100%    Last Pain:  Vitals:   03/31/21 1215  TempSrc:   PainSc: Asleep                 Sava Proby,W. EDMOND

## 2021-03-31 NOTE — Op Note (Addendum)
03/31/2021  10:55 AM  PATIENT:  Tanya Murray  45 y.o. female  PRE-OPERATIVE DIAGNOSIS:  Menorrhagia, Intramural and subserousal uterine fibroids, anemia  POST-OPERATIVE DIAGNOSIS:  Menorrhagia, Intramural and subserousal uterine fibroids, anemia  PROCEDURE:  Procedure(s): HYSTERECTOMY ABDOMINAL WITH SALPINGECTOMY APPLICATION OF CELL SAVER  SURGEON:  Megan Salon  ASSISTANTS: Dr. Jeral Pinch.  An experienced assistant was required given the standard of surgical care given the complexity of the case.  This assistant was needed for exposure, dissection, suctioning, retraction, instrument exchange and for overall help during the procedure.  RNFA help was also unavailable.  ANESTHESIA:   general and with TAP block  ESTIMATED BLOOD LOSS: 350 mL  BLOOD ADMINISTERED:none   FLUIDS: 2000cc LR  UOP: 100cc clear UOP  SPECIMEN:  uterus (weight in OR was 4957g) and bilateral fallopian tubes  DISPOSITION OF SPECIMEN:  PATHOLOGY  FINDINGS: significantly enlarged uterus with very large fibroids, normal ovaries, normal fallopian tubes  DESCRIPTION OF OPERATION: Patient is taken to the operating room. She is placed in the supine position. She is a running IV in place. Informed consent was present on the chart. SCDs on her lower extremities and functioning properly. General endotracheal anesthesia was administered by the anesthesia staff without difficulty. Once adequate anesthesia was confirmed the legs are placed in the low lithotomy position in [place].   Chlor prep was then used to prep the abdomen and Betadine was used to prep the inner thighs, perineum and vagina. Once 3 minutes had past the patient was draped in a normal standard fashion. A foley catheter was placed to straight drain and kept sterile on the field.   A midline skin incision was made and carried down through the subcutaneous fat and tissue. The fascia was identified and nicked in the midline. The fascial  incision was extended caudally and cephalad.  The peritoneum was identified and opened sharply. The peritoneal incision was extended superiorly and inferiorly down to the level of the bladder.  A sponge stick was placed in the vaginal (and then sterile gloves were changed) to help identify the base of the cervix.  The bulky uterus was delivered through the incision. There were adhesions from the omentum on the fundus of the uterus, clearly giving blood supply to the uterus through sizable vessels.  The omentum was dissected off the uterus using hemostats to make small pedicles and the curved Ligasure device for cautery and coagulation.  No large retractor was used during the case. The bowel was not packed as was easy to keep out of the way due to the significant space present in the abdomen once the uterus was delivered.    Attention was turned to the left side. With uterus on stretch the right round ligament was suture-ligated and incised. The uteroovarian pedicle was isolated and coagulated in multiple locations prior to cutting with the ligasure device.  Then the posterior leaf of the broad ligament was opened. Next the inferior leaf the broad ligament was opened. The bladder flap was created.  This dissection was continued down the lower portion of the uterus towards the cervix as it was pulled up on the lower uterine segment.  The bladder was below the level of the uterine artery at this point on the left once the dissection was completed.  Attention was turned to the left side. The left round ligament was suture-ligated and opened. The superior leaf of the broad ligament was opened. The uteroovarian pedicle was isolated and clamped and cauterized with  the ligasure in several locations prior to incising.  Then the inferior leaf of the broad ligament was opened and the the bladder flap dissection was carried to the midline and down below the level of the uterine artery.    Next the uterine arteries were  skeletonized.  Then the left uterine artery was clamped with a straight Haney clamp.  A second clamp was placed for back bleeding.  This pedicle was cut and doubly suture ligated with with #0 Vicryl.  The same thing was performed on the right side.  Then the dissection of the bladder flap was continued down the cervix.  The cardinal ligaments were serially clamped cut and suture-ligated with #0 Vicryl using straight Heaney clamps getting close to the cervix. At the base of the cervix a curved Heaney clamp was placed on each side. These pedicles were cut and suture-ligated with #0 Vicryl.  Then the remaining vaginal mucosa was incised close to the cervix as the colpotomy was completed, freeing the specimen.  The uterus weighed 4957grams.  The the vagina was closed with additional figure-of-eight sutures of #0 Vicryl. Care was taken with the corner suture to ensure the corners were well sutures.  The pelvis was irrigated. There was minimal bleeding.  Ureters were seem deep in the pelvis with peristalsis.  Pelvis was irrigated again.  No bleeding was noted.   The incision was then close with a double stranded PDS from the inferior portion of the incision to the middle and then from the superior portion of the incision to the midline.  Care was taken to get good fascial bites but keep these close together to help prevent future hernia formation.  Peritoneum was included in this incision closure.  Malleable retractor was place in the incision during the closure to ensure bowel was out of the way.  Retractor was removed.  Once the incision was closed the sutures were tied in the midline.  The incision as irrigated.  No bleeding was noted.  The subcutaneous fat was re approximated with single interrupted sutures of plain gut.  Then the skin was closed with a subcuticular stitch of 3.0 Vicryl.   Skin was cleansed.  Benzoin and steri-strips were applied.  Then a honeycomb dressing was applied.  Sponge stick in the vagina  was removed.    Patient tolerated procedure well. Legs are positioned back to the supine position. Sponge, lap, needle, initially counts were correct x2. She was awakened from anesthesia, extubated and taken to recovery in stable condition.   Again, uterus weight 4957grams in the operating room.  Total surgical time for hysterectomy was around 2 hours and 40 minutes, about 50% more than for more normal sized uterus.  COUNTS:  YES  PLAN OF CARE: Transfer to PACU

## 2021-03-31 NOTE — Transfer of Care (Signed)
Immediate Anesthesia Transfer of Care Note  Patient: Tanya Murray  Procedure(s) Performed: HYSTERECTOMY ABDOMINAL WITH SALPINGECTOMY (Bilateral) APPLICATION OF CELL SAVER  Patient Location: PACU  Anesthesia Type:General  Level of Consciousness: awake, drowsy and patient cooperative  Airway & Oxygen Therapy: Patient Spontanous Breathing and Patient connected to face mask oxygen  Post-op Assessment: Report given to RN and Post -op Vital signs reviewed and stable  Post vital signs: Reviewed and stable  Last Vitals:  Vitals Value Taken Time  BP 185/95 1053  Temp    Pulse    Resp 19 03/31/21 1053  SpO2 100 1053  Vitals shown include unvalidated device data.  Last Pain:  Vitals:   03/31/21 0659  TempSrc:   PainSc: 0-No pain      Patients Stated Pain Goal: 2 (55/73/22 0254)  Complications: No notable events documented.

## 2021-03-31 NOTE — Progress Notes (Signed)
Patient came to short stay with PCA Dilaudid intact.  PCA discontinued and attempted to waste dilaudid syringe in our pyxis.  Unable to do so, pharmacy was called and they state to bring to pharmacy to waste.  Patient had 73ml left in the syringe, wasted with pharmacist and he documented in the pharmacy computer.   Nash Dimmer, RN also confirmed 48ml in the syringe before taking to the pharmacy.

## 2021-03-31 NOTE — Progress Notes (Signed)
Day of Surgery Procedure(s) (LRB): HYSTERECTOMY ABDOMINAL WITH SALPINGECTOMY (Bilateral) APPLICATION OF CELL SAVER (N/A)  Subjective: Patient reports adequate pain control, no nausea.  She is having some feeling of hunger.  Surgery discussed.  Questions answered.    Objective: I have reviewed patient's vital signs, intake and output, medications, and labs. Vitals:   03/31/21 1628 03/31/21 1700  BP: (!) 149/80 (!) 141/80  Pulse: 75 78  Resp: 18   Temp:  98.1 F (36.7 C)  SpO2: 100% 100%   UOP:  2100cc clear UOP  General: alert and no distress Resp: clear to auscultation bilaterally Cardio: regular rate and rhythm and systolic murmur: systolic ejection 3/6,   no radiation of murmur GI: incision: clean, dry, and intact and soft with occasional BS Extremities: extremities normal, atraumatic, no cyanosis or edema and SCDs in placed Vaginal Bleeding: minimal Inc: C/D/I  Assessment: s/p Procedure(s) with comments: HYSTERECTOMY ABDOMINAL WITH SALPINGECTOMY (Bilateral) - Patient will be admitted pre-op for [place]. APPLICATION OF CELL SAVER (N/A): stable and progressing well  Plan: Advance diet Encourage ambulation Advance to PO medication CBC and CMP in AM.  LOS: 0 days    Megan Salon 03/31/2021, 8:51 PM

## 2021-03-31 NOTE — Anesthesia Procedure Notes (Signed)
Anesthesia Regional Block: TAP block   Pre-Anesthetic Checklist: , timeout performed,  Correct Patient, Correct Site, Correct Laterality,  Correct Procedure, Correct Position, site marked,  Risks and benefits discussed,  Pre-op evaluation,  At surgeon's request and post-op pain management  Laterality: Right and Left  Prep: Maximum Sterile Barrier Precautions used, chloraprep       Needles:  Injection technique: Single-shot  Needle Type: Echogenic Stimulator Needle     Needle Length: 9cm  Needle Gauge: 21     Additional Needles:   Procedures:,,,, ultrasound used (permanent image in chart),,    Narrative:  Start time: 03/31/2021 7:21 AM End time: 03/31/2021 7:31 AM Injection made incrementally with aspirations every 5 mL. Anesthesiologist: Roderic Palau, MD  Additional Notes: 2% Lidocaine skin wheel.Bilateral TAP block.

## 2021-03-31 NOTE — Anesthesia Postprocedure Evaluation (Deleted)
Anesthesia Post Note  Patient: Sibley D Tobler  Procedure(s) Performed: HYSTERECTOMY ABDOMINAL WITH SALPINGECTOMY (Bilateral) APPLICATION OF CELL SAVER     Patient location during evaluation: PACU Anesthesia Type: General Level of consciousness: awake and alert Pain management: pain level controlled Vital Signs Assessment: post-procedure vital signs reviewed and stable Respiratory status: spontaneous breathing, nonlabored ventilation, respiratory function stable and patient connected to nasal cannula oxygen Cardiovascular status: blood pressure returned to baseline and stable Postop Assessment: no apparent nausea or vomiting Anesthetic complications: no   No notable events documented.  Last Vitals:  Vitals:   03/31/21 1347 03/31/21 1443  BP: (!) 166/86 (!) 157/85  Pulse: 77 80  Resp: 17 18  Temp: (!) 36.4 C (!) 35.6 C  SpO2: 100% 100%    Last Pain:  Vitals:   03/31/21 1215  TempSrc:   PainSc: Asleep                 Cameshia Cressman,W. EDMOND

## 2021-04-01 ENCOUNTER — Encounter (HOSPITAL_COMMUNITY): Payer: Self-pay | Admitting: Obstetrics & Gynecology

## 2021-04-01 LAB — URINE CULTURE: Culture: NO GROWTH

## 2021-04-01 LAB — CBC
HCT: 32.5 % — ABNORMAL LOW (ref 36.0–46.0)
Hemoglobin: 10.3 g/dL — ABNORMAL LOW (ref 12.0–15.0)
MCH: 28.8 pg (ref 26.0–34.0)
MCHC: 31.7 g/dL (ref 30.0–36.0)
MCV: 90.8 fL (ref 80.0–100.0)
Platelets: 222 10*3/uL (ref 150–400)
RBC: 3.58 MIL/uL — ABNORMAL LOW (ref 3.87–5.11)
RDW: 14.5 % (ref 11.5–15.5)
WBC: 5.8 10*3/uL (ref 4.0–10.5)
nRBC: 0 % (ref 0.0–0.2)

## 2021-04-01 LAB — BASIC METABOLIC PANEL
Anion gap: 5 (ref 5–15)
BUN: <5 mg/dL — ABNORMAL LOW (ref 6–20)
CO2: 28 mmol/L (ref 22–32)
Calcium: 8.1 mg/dL — ABNORMAL LOW (ref 8.9–10.3)
Chloride: 103 mmol/L (ref 98–111)
Creatinine, Ser: 0.67 mg/dL (ref 0.44–1.00)
GFR, Estimated: >60 mL/min (ref >60)
Glucose, Bld: 126 mg/dL — ABNORMAL HIGH (ref 70–99)
Potassium: 3.5 mmol/L (ref 3.5–5.1)
Sodium: 136 mmol/L (ref 135–145)

## 2021-04-01 LAB — SURGICAL PATHOLOGY

## 2021-04-01 MED ORDER — GABAPENTIN 100 MG PO CAPS: 100.0000 mg | ORAL_CAPSULE | Freq: Three times a day (TID) | ORAL | 0 refills | Status: DC

## 2021-04-01 MED ORDER — OXYCODONE-ACETAMINOPHEN 5-325 MG PO TABS: 1.0000 | ORAL_TABLET | ORAL | 0 refills | Status: DC | PRN

## 2021-04-01 MED ORDER — IBUPROFEN 800 MG PO TABS
800.0000 mg | ORAL_TABLET | Freq: Three times a day (TID) | ORAL | 0 refills | Status: DC | PRN
Start: 2021-04-01 — End: 2021-05-12

## 2021-04-01 MED ORDER — PANTOPRAZOLE SODIUM 40 MG PO TBEC: 40.0000 mg | DELAYED_RELEASE_TABLET | Freq: Every day | ORAL | Status: DC

## 2021-04-01 MED FILL — Heparin Sodium (Porcine) Inj 1000 Unit/ML: INTRAMUSCULAR | Qty: 30 | Status: AC

## 2021-04-01 MED FILL — Sodium Chloride IV Soln 0.9%: INTRAVENOUS | Qty: 1000 | Status: AC

## 2021-04-01 NOTE — Plan of Care (Signed)

## 2021-04-01 NOTE — Discharge Instructions (Signed)
Post Op Hysterectomy Instructions Please read the instructions below. Refer to these instructions for the next few weeks. These instructions provide you with general information on caring for yourself after surgery. Your caregiver may also give you specific instructions. While your treatment has been planned according to the most current medical practices available, unavoidable problems sometimes happen. If you have any problems or questions after you leave, please call your caregiver.  HOME CARE INSTRUCTIONS Healing will take time. You will have discomfort, tenderness, swelling and bruising at the operative site for a couple of weeks. This is normal and will get better as time goes on.  Only take over-the-counter or prescription medicines for pain, discomfort or fever as directed by your caregiver.  Do not take aspirin. It can cause bleeding.  Do not drive when taking pain medication.  Follow your caregiver's advice regarding diet, exercise, lifting, driving and general activities.  Resume your usual diet as directed and allowed.  Get plenty of rest and sleep.  Do not douche, use tampons, or have sexual intercourse until your caregiver gives you permission. .  Take your temperature if you feel hot or flushed.  You may shower today when you get home.  No tub bath for one week.   Do not drink alcohol until you are not taking any narcotic pain medications.  Try to have someone home with you for a week or two to help with the household activities.   Be careful over the next two to three weeks with any activities at home that involve lifting, pushing, or pulling.  Listen to your body--if something feels uncomfortable to do, then don't do it. Make sure you and your family understands everything about your operation and recovery.  Walking up stairs is fine. Do not sign any legal documents until you feel normal again.  Keep all your follow-up appointments as recommended by your caregiver.   PLEASE CALL  THE OFFICE IF: There is swelling, redness or increasing pain in the wound area.  Pus is coming from the wound.  You notice a bad smell from the wound or surgical dressing.  You have pain, redness and swelling from the intravenous site.  The wound is breaking open (the edges are not staying together).   You develop pain or bleeding when you urinate.  You develop abnormal vaginal discharge.  You have any type of abnormal reaction or develop an allergy to your medication.  You need stronger pain medication for your pain   SEEK IMMEDIATE MEDICAL CARE: You develop a temperature of 100.5 or higher.  You develop abdominal pain.  You develop chest pain.  You develop shortness of breath.  You pass out.  You develop pain, swelling or redness of your leg.  You develop heavy vaginal bleeding with or without blood clots.   MEDICATIONS: Restart your regular medications BUT wait one week before restarting all vitamins and mineral supplements Use Motrin 800mg  every 8 hours for the next several days.  This will help you use less Percocet.  Use the Percocet 5/325 1-2 tabs every 4-6 hours as needed for pain. Neurontin/Gabapenin 100mg  three times daily. This helps with using less narcotic pain medication.  You can decrease and stop this as your pain improves. You may use an over the counter stool softener like Colace or Dulcolax to help with starting a bowel movement.  Start the day after you go home.  Warm liquids, fluids, and ambulation help too.  If you have not had a bowel movement  in four days, you need to call the office.

## 2021-04-01 NOTE — Progress Notes (Signed)
1 Day Post-Op Procedure(s) (LRB): HYSTERECTOMY ABDOMINAL WITH SALPINGECTOMY (Bilateral) APPLICATION OF CELL SAVER (N/A)  Subjective: Patient reports good pain control.  Took very little pain medications.  Catheter removed this morning.  Hb 10.3.  electrolytes fine.  Reviewed with pt.  Did rest some last night.  Walked this morning.    Objective: I have reviewed patient's vital signs, intake and output, medications, and labs.  General: alert, cooperative, and no distress Resp: clear to auscultation bilaterally Cardio: regular rate and rhythm GI: soft, non-tender; bowel sounds normal; no masses,  no organomegaly Extremities: extremities normal, atraumatic, no cyanosis or edema Vaginal Bleeding: none Inc: small amount of blood on dressing  Assessment: s/p Procedure(s) with comments: HYSTERECTOMY ABDOMINAL WITH SALPINGECTOMY (Bilateral) - Patient will be admitted pre-op for [place]. APPLICATION OF CELL SAVER (N/A): stable and progressing well  Plan: Encourage ambulation Advance to PO medication Discontinue IV fluids Voiding trial today and possible d/c later  LOS: 1 day    Megan Salon 04/01/2021, 7:21 AM

## 2021-04-01 NOTE — Progress Notes (Signed)
The patient is receiving Protonix by the intravenous route.  Based on criteria approved by the Pharmacy and [place], the medication is being converted to the equivalent oral dose form.  These criteria include: -No active GI bleeding -Able to tolerate diet of full liquids (or better) or tube feeding -Able to tolerate other medications by the oral or enteral route  If you have any questions about this conversion, please contact the Pharmacy Department (phone [phone]).  Thank you.   Minda Ditto PharmD 04/01/2021, 12:53 PM

## 2021-04-04 LAB — BPAM RBC
Blood Product Expiration Date: 202207262359
Blood Product Expiration Date: 202207262359
ISSUE DATE / TIME: 202206301709
Unit Type and Rh: 6200
Unit Type and Rh: 6200

## 2021-04-04 LAB — TYPE AND SCREEN
ABO/RH(D): A POS
Antibody Screen: NEGATIVE
Unit division: 0
Unit division: 0

## 2021-04-05 ENCOUNTER — Other Ambulatory Visit (HOSPITAL_COMMUNITY): Payer: Self-pay

## 2021-04-05 ENCOUNTER — Other Ambulatory Visit (HOSPITAL_BASED_OUTPATIENT_CLINIC_OR_DEPARTMENT_OTHER): Payer: Self-pay | Admitting: Obstetrics & Gynecology

## 2021-04-05 NOTE — Telephone Encounter (Signed)
Called pt and she does not think she will need the refill.

## 2021-04-05 NOTE — Discharge Summary (Signed)
Patient ID: Tanya Murray [id number]  Admit date: 03/30/2021 12:23 PM  Discharge date: 04/01/2021  2:12 PM  Admission Diagnoses: Uterine leiomyoma [D25.9] Fibroid uterus [D25.9]  Discharge Diagnoses:  Same as Admission Diagnosis  Discharged Condition: good  Hospital Course: Patient admitted day prior to surgery via interventional radiology for uterine artery embolization with Dr. Wallene Dales.  Procedure went well.  She did have a Dilaudid PCA for pain control which she used minimally.  She was seen in the AM of surgery.  Pre op hb was 12.4.  TAH/Bilateral salpingectomy was performed for significantly enlarged uterus that weight almost 5000 grams.  EBL was 350cc.  Surgery was without complications.  Patient transferred to the PACU and then to the third floor for the remainder of her hospitalization.  Patient had dilaudid PCA, low dose, for pain management.  She asked for very little pain medication so this was switched to prn dilaudid.  She was also given percocet and gabapentin for pain control as well as IV toradol.  She was seen in the evening of POD #0 and in the morning of POD #1.  VSS/AF.  UOP adequate.  She was transitioned to regular diet and to oral pain meds over night.  In AM, foley was removed and she voided easily and multiple times.  She was able to ambulate without difficulty.  Post op hemoglobin was 9.3.  by midday on POD#1, I felt she was safe for discharge home.  Consults: None  Significant Diagnostic Studies: labs: post op hb was 9.3  Treatments: surgery: TAH and bilateral salpingectomy and uterine artery embolization  Discharge Exam: See last exam in POD#1 note.  Disposition: Home or Self Care  Signed: Felipa Emory 04/19/2011, 6:26 PM

## 2021-04-07 ENCOUNTER — Other Ambulatory Visit: Payer: Self-pay

## 2021-04-07 ENCOUNTER — Encounter (HOSPITAL_BASED_OUTPATIENT_CLINIC_OR_DEPARTMENT_OTHER): Payer: Self-pay | Admitting: Obstetrics & Gynecology

## 2021-04-07 ENCOUNTER — Ambulatory Visit (INDEPENDENT_AMBULATORY_CARE_PROVIDER_SITE_OTHER): Payer: BC Managed Care – PPO | Admitting: Obstetrics & Gynecology

## 2021-04-07 VITALS — BP 137/82 | HR 65 | Wt 176.0 lb

## 2021-04-07 DIAGNOSIS — R011 Cardiac murmur, unspecified: Secondary | ICD-10-CM

## 2021-04-07 DIAGNOSIS — Z1231 Encounter for screening mammogram for malignant neoplasm of breast: Secondary | ICD-10-CM

## 2021-04-07 DIAGNOSIS — Z9889 Other specified postprocedural states: Secondary | ICD-10-CM

## 2021-04-07 NOTE — Progress Notes (Signed)
GYNECOLOGY  VISIT  CC:   post op recheck  HPI: 45 y.o. [place] Married [place] American female here for recheck after undergoing TAH/bilateral salpingectomy on 03/31/2021.  She reports bleeding is none.  She has minimal pain.  Taking only ibuprofen at this point.  Bowel function is Normal.  Bladder function is normal.    Pathology reviewed:  Yes .  Questions answered.    MEDS:   Current Outpatient Medications on File Prior to Visit  Medication Sig Dispense Refill   FeFum-FePoly-FA-B Cmp-C-Biot (INTEGRA PLUS) CAPS Take 1 capsule by mouth every morning. 30 capsule 2   gabapentin (NEURONTIN) 100 MG capsule Take 1 capsule (100 mg total) by mouth 3 (three) times daily. You can decrease and stop this as pain improves. 30 capsule 0   hydrochlorothiazide (HYDRODIURIL) 25 MG tablet TAKE 1 TABLET(25 MG) BY MOUTH DAILY 30 tablet 1   ibuprofen (ADVIL) 800 MG tablet Take 1 tablet (800 mg total) by mouth every 8 (eight) hours as needed. 30 tablet 0   oxyCODONE-acetaminophen (PERCOCET/ROXICET) 5-325 MG tablet Take 1-2 tablets by mouth every 4 (four) hours as needed for moderate pain. 30 tablet 0   No current facility-administered medications on file prior to visit.    SH:  Smoking No    PHYSICAL EXAMINATION:    BP 137/82   Pulse 65   Wt 176 lb (79.8 kg)   BMI 25.99 kg/m     General appearance: alert, cooperative and appears stated age CV:  Regular rate and rhythm, 3/6 systolic murmur Lungs:  clear to auscultation, no wheezes, rales or rhonchi, symmetric air entry Abdomen: soft, non-tender; bowel sounds normal; no masses,  no organomegaly Incisions:  C/D/I  Chaperone, Octaviano Batty, was present for exam.  Assessment/Plan:  1. Post-operative state - Doing well.  Recheck 1 month  2.  Cardiac murmur - referral to cardiology  3.  Anemia - has follow up in October

## 2021-04-08 ENCOUNTER — Ambulatory Visit (HOSPITAL_BASED_OUTPATIENT_CLINIC_OR_DEPARTMENT_OTHER): Payer: BC Managed Care – PPO | Admitting: Obstetrics & Gynecology

## 2021-04-14 ENCOUNTER — Ambulatory Visit (HOSPITAL_BASED_OUTPATIENT_CLINIC_OR_DEPARTMENT_OTHER): Payer: BC Managed Care – PPO | Admitting: Obstetrics & Gynecology

## 2021-04-28 ENCOUNTER — Other Ambulatory Visit (HOSPITAL_BASED_OUTPATIENT_CLINIC_OR_DEPARTMENT_OTHER): Payer: Self-pay | Admitting: Obstetrics & Gynecology

## 2021-04-28 DIAGNOSIS — I10 Essential (primary) hypertension: Secondary | ICD-10-CM

## 2021-05-12 ENCOUNTER — Encounter (HOSPITAL_BASED_OUTPATIENT_CLINIC_OR_DEPARTMENT_OTHER): Payer: Self-pay | Admitting: Radiology

## 2021-05-12 ENCOUNTER — Ambulatory Visit (HOSPITAL_BASED_OUTPATIENT_CLINIC_OR_DEPARTMENT_OTHER)
Admission: RE | Admit: 2021-05-12 | Discharge: 2021-05-12 | Disposition: A | Discharge status: Home / Self Care | Payer: BC Managed Care – PPO | Source: Ambulatory Visit | Attending: Obstetrics & Gynecology | Admitting: Obstetrics & Gynecology

## 2021-05-12 ENCOUNTER — Encounter (HOSPITAL_BASED_OUTPATIENT_CLINIC_OR_DEPARTMENT_OTHER): Payer: Self-pay | Admitting: Obstetrics & Gynecology

## 2021-05-12 ENCOUNTER — Other Ambulatory Visit: Payer: Self-pay

## 2021-05-12 ENCOUNTER — Ambulatory Visit (INDEPENDENT_AMBULATORY_CARE_PROVIDER_SITE_OTHER): Payer: BC Managed Care – PPO | Admitting: Obstetrics & Gynecology

## 2021-05-12 VITALS — BP 143/84 | HR 72 | Ht 69.0 in | Wt 187.8 lb

## 2021-05-12 DIAGNOSIS — Z9889 Other specified postprocedural states: Secondary | ICD-10-CM

## 2021-05-12 DIAGNOSIS — Z1231 Encounter for screening mammogram for malignant neoplasm of breast: Secondary | ICD-10-CM | POA: Diagnosis not present

## 2021-05-12 NOTE — Progress Notes (Signed)
GYNECOLOGY  VISIT  CC:   post op recheck  HPI: 45 y.o. [place] Mar[place] or [place] American female here for recheck after undergoing TAH/bilateral salpingectomy on 03/31/2021.  She reports bleeding is none.  She has no pain.  Bowel function is Normal.  Bladder function is normal.     MEDS:   Current Outpatient Medications on File Prior to Visit  Medication Sig Dispense Refill   ibuprofen (ADVIL) 800 MG tablet Take 1 tablet (800 mg total) by mouth every 8 (eight) hours as needed. 30 tablet 0   FeFum-FePoly-FA-B Cmp-C-Biot (INTEGRA PLUS) CAPS Take 1 capsule by mouth every morning. (Patient not taking: Reported on 05/12/2021) 30 capsule 2   gabapentin (NEURONTIN) 100 MG capsule Take 1 capsule (100 mg total) by mouth 3 (three) times daily. You can decrease and stop this as pain improves. (Patient not taking: Reported on 05/12/2021) 30 capsule 0   hydrochlorothiazide (HYDRODIURIL) 25 MG tablet TAKE 1 TABLET(25 MG) BY MOUTH DAILY (Patient not taking: Reported on 05/12/2021) 30 tablet 1   No current facility-administered medications on file prior to visit.    SH:  Smoking No    PHYSICAL EXAMINATION:    BP (!) 143/84 (BP Location: Right Arm, Patient Position: Sitting, Cuff Size: Large)   Pulse 72   Ht '5\' 9"'$  (1.753 m)   Wt 187 lb 12.8 oz (85.2 kg)   LMP  (LMP Unknown)   BMI 27.73 kg/m     General appearance: alert, cooperative and appears stated age Abdomen: soft, non-tender; bowel sounds normal; no masses,  no organomegaly Incisions:  C/D/I  Pelvic: External genitalia:  no lesions              Urethra:  normal appearing urethra with no masses, tenderness or lesions              Bartholins and Skenes: normal                 Vagina: normal appearing vagina with normal color and discharge, no lesions              Cervix: absent              Bimanual Exam:  Uterus:  uterus absent              Adnexa: no mass, fullness, tenderness  Chaperone, Octaviano Batty, was present for  exam.  Assessment/Plan: 1. Post-operative state - Doing well.  Restrictions discussed.  Pt is released to return to full time work and full activity.  Care of incision discussed.  Will recheck 1 year with her.

## 2021-05-16 ENCOUNTER — Other Ambulatory Visit: Payer: Self-pay | Admitting: Obstetrics & Gynecology

## 2021-05-16 DIAGNOSIS — R928 Other abnormal and inconclusive findings on diagnostic imaging of breast: Secondary | ICD-10-CM

## 2021-06-13 ENCOUNTER — Ambulatory Visit: Payer: BC Managed Care – PPO

## 2021-06-13 ENCOUNTER — Ambulatory Visit: Admission: RE | Admit: 2021-06-13 | Payer: BC Managed Care – PPO | Source: Ambulatory Visit

## 2021-06-13 ENCOUNTER — Other Ambulatory Visit: Payer: BC Managed Care – PPO

## 2021-06-13 ENCOUNTER — Other Ambulatory Visit: Payer: Self-pay

## 2021-06-13 ENCOUNTER — Ambulatory Visit
Admission: RE | Admit: 2021-06-13 | Discharge: 2021-06-13 | Disposition: A | Discharge status: Home / Self Care | Payer: BC Managed Care – PPO | Source: Ambulatory Visit | Attending: Obstetrics & Gynecology | Admitting: Obstetrics & Gynecology

## 2021-06-13 DIAGNOSIS — R928 Other abnormal and inconclusive findings on diagnostic imaging of breast: Secondary | ICD-10-CM

## 2021-07-05 ENCOUNTER — Inpatient Hospital Stay: Payer: BC Managed Care – PPO | Attending: Family Medicine

## 2021-07-12 ENCOUNTER — Inpatient Hospital Stay: Payer: BC Managed Care – PPO | Admitting: Internal Medicine

## 2021-07-15 NOTE — Progress Notes (Signed)
Cardiology Office Note:    Date:  07/18/2021   ID:  Tanya Murray, DOB 05-02-1976, MRN [id number]  PCP:  [name], DO   [facility] Providers Cardiologist:  None     Referring MD: Megan Salon, MD   No chief complaint on file.   History of Present Illness:    Tanya Murray is a 45 y.o. female with a hx of anemia, heart murmur, MGUS, and hypertension, here for the evaluation of cardiac murmur. She underwent a hysterectomy with bilateral salpingectomy 03/31/2021. She saw Dr. Sabra Heck 04/07/2021 for a post-operative follow-up. A 3/6 systolic murmur was heard on exam and she was referred to cardiology.  Today, she is feeling great overall. At home she has noticed her blood pressure has been more controlled since her surgery. Prior to her hysterectomy she did have issues with LE edema, but this seems to have resolved. Lately, she has not been exercising regularly. When she does exercise or perform strenuous activities she generally feels fine. For her diet she mostly eats at home, and sometimes orders out. She does not typically monitor her salt intake. She has never been a smoker. Alcohol consumption is limited to one glass every other day. She denies any palpitations, chest pain, or shortness of breath. No lightheadedness, headaches, syncope, orthopnea, or PND.   Past Medical History:  Diagnosis Date   Adopted    Anemia    Fibroid uterus 03/31/2021   Heart murmur    Hypertension    Murmur 07/18/2021   Thyromegaly 07/18/2021   Uterine leiomyoma 03/30/2021    Past Surgical History:  Procedure Laterality Date   HYSTERECTOMY ABDOMINAL WITH SALPINGECTOMY Bilateral 03/31/2021   Procedure: HYSTERECTOMY ABDOMINAL WITH SALPINGECTOMY;  Surgeon: Megan Salon, MD;  Location: [facility];  Service: Gynecology;  Laterality: Bilateral;  Patient will be admitted pre-op for [place].   IR ANGIOGRAM PELVIS SELECTIVE OR SUPRASELECTIVE  03/30/2021   IR ANGIOGRAM SELECTIVE EACH ADDITIONAL VESSEL  03/30/2021    IR ANGIOGRAM SELECTIVE EACH ADDITIONAL VESSEL  03/30/2021   IR EMBO TUMOR ORGAN ISCHEMIA INFARCT INC GUIDE ROADMAPPING  03/30/2021   IR RADIOLOGIST EVAL & MGMT  02/10/2021   IR US GUIDE VASC ACCESS RIGHT  03/30/2021    Current Medications: No outpatient medications have been marked as taking for the 07/18/21 encounter (Office Visit) with Skeet Latch, MD.     Allergies:   Tape   Social History   Socioeconomic History   Marital status: Married    Spouse name: Not on file   Number of children: Not on file   Years of education: Not on file   Highest education level: Not on file  Occupational History   Not on file  Tobacco Use   Smoking status: Never   Smokeless tobacco: Never  Vaping Use   Vaping Use: Never used  Substance and Sexual Activity   Alcohol use: Yes    Comment: socially   Drug use: Never   Sexual activity: Yes    Birth control/protection: None  Other Topics Concern   Not on file  Social History Narrative   Not on file   Social Determinants of Health   Financial Resource Strain: Low Risk    Difficulty of Paying Living Expenses: Not very hard  Food Insecurity: No Food Insecurity   Worried About Running Out of Food in the Last Year: Never true   [place] in the Last Year: Never true  Transportation Needs: No Transportation Needs  Lack of Transportation (Medical): No   Lack of Transportation (Non-Medical): No  Physical Activity: Inactive   Days of Exercise per Week: 0 days   Minutes of Exercise per Session: 0 min  Stress: Not on file  Social Connections: Not on file     Family History: The patient's family history is not on file. She was adopted.  ROS:   Please see the history of present illness.    All other systems reviewed and are negative.  EKGs/Labs/Other Studies Reviewed:    The following studies were reviewed today: No prior cardiovascular studies available.   EKG:    07/18/2021: Sinus rhythm. Rate 66 bpm.  Recent  Labs: 12/16/2020: ALT 38 04/01/2021: BUN <5; Creatinine, Ser 0.67; Hemoglobin 10.3; Platelets 222; Potassium 3.5; Sodium 136   Recent Lipid Panel No results found for: CHOL, TRIG, HDL, CHOLHDL, VLDL, LDLCALC, LDLDIRECT    Physical Exam:    Wt Readings from Last 3 Encounters:  07/18/21 200 lb 14.4 oz (91.1 kg)  05/12/21 187 lb 12.8 oz (85.2 kg)  04/07/21 176 lb (79.8 kg)     VS:  BP 138/86 (BP Location: Left Arm, Patient Position: Sitting, Cuff Size: Normal)   Pulse 66   Ht 5\' 9"  (1.753 m)   Wt 200 lb 14.4 oz (91.1 kg)   LMP  (LMP Unknown)   BMI 29.67 kg/m  , BMI Body mass index is 29.67 kg/m. GENERAL:  Well appearing HEENT: Pupils equal round and reactive, fundi not visualized, oral mucosa unremarkable NECK:  No jugular venous distention, waveform within normal limits, carotid upstroke brisk and symmetric, no bruits, +thyromegaly with small R thyroid nodule LUNGS:  Clear to auscultation bilaterally HEART:  RRR.  PMI not displaced or sustained,S1 and S2 within normal limits, no S3, no S4, no clicks, no rubs, no murmurs ABD:  Flat, positive bowel sounds normal in frequency in pitch, no bruits, no rebound, no guarding, no midline pulsatile mass, no hepatomegaly, no splenomegaly EXT:  2 plus pulses throughout, no edema, no cyanosis no clubbing SKIN:  No rashes no nodules NEURO:  Cranial nerves II through XII grossly intact, motor grossly intact throughout PSYCH:  Cognitively intact, oriented to person place and time   ASSESSMENT:    1. Primary hypertension   2. Murmur   3. Iron deficiency anemia due to chronic blood loss   4. Thyromegaly    PLAN:    Hypertension BP improved after her surgery and she is no longer on any antihypertensives.  Today her blood pressure was above the goal of 130/80.  We discussed the fact that it probably can be less than that if she exercises regularly and limits the salt in her diet.  She is going to work on increasing her exercise to at least 150  minutes weekly and keep her sodium under 2500 mg a day.  She will follow-up with her PCP on this and check her blood pressure more closely at home.  Murmur She was noted to have a murmur on exam postoperatively.  She was mildly anemic at the time and I suspect this is what was going on.  I do not detect a murmur on exam today.  She also has no heart failure symptoms and is euvolemic on exam.  We will repeat her TSH and CBC.  We will defer echocardiogram at this time.  Iron deficiency anemia Check a CBC.  Thyromegaly Mild thyromegaly noted on exam.  Feels as though she might have a right thyroid nodule.  TSH was normal in March.  Repeat TSH now and get a thyroid ultrasound.   Disposition: FU with Davionne Mastrangelo C. Oval Linsey, MD, [facility]  PRN.  Medication Adjustments/Labs and Tests Ordered: Current medicines are reviewed at length with the patient today.  Concerns regarding medicines are outlined above.   Orders Placed This Encounter  Procedures   US THYROID   TSH   CBC   EKG 12-Lead    No orders of the defined types were placed in this encounter.  There are no Patient Instructions on file for this visit.    I,Mathew Stumpf,acting as a Education administrator for Skeet Latch, MD.,have documented all relevant documentation on the behalf of Skeet Latch, MD,as directed by  Skeet Latch, MD while in the presence of Skeet Latch, MD.   I, [place] Oval Linsey, MD have reviewed all documentation for this visit.  The documentation of the exam, diagnosis, procedures, and orders on 07/18/2021 are all accurate and complete.   Signed, Skeet Latch, MD  07/18/2021 9:00 AM    [place]

## 2021-07-18 ENCOUNTER — Other Ambulatory Visit: Payer: Self-pay

## 2021-07-18 ENCOUNTER — Encounter (HOSPITAL_BASED_OUTPATIENT_CLINIC_OR_DEPARTMENT_OTHER): Payer: Self-pay | Admitting: Cardiovascular Disease

## 2021-07-18 ENCOUNTER — Ambulatory Visit (HOSPITAL_BASED_OUTPATIENT_CLINIC_OR_DEPARTMENT_OTHER): Payer: BC Managed Care – PPO | Admitting: Cardiovascular Disease

## 2021-07-18 DIAGNOSIS — I10 Essential (primary) hypertension: Secondary | ICD-10-CM | POA: Diagnosis not present

## 2021-07-18 DIAGNOSIS — E010 Iodine-deficiency related diffuse (endemic) goiter: Secondary | ICD-10-CM

## 2021-07-18 DIAGNOSIS — D500 Iron deficiency anemia secondary to blood loss (chronic): Secondary | ICD-10-CM | POA: Diagnosis not present

## 2021-07-18 DIAGNOSIS — R011 Cardiac murmur, unspecified: Secondary | ICD-10-CM | POA: Diagnosis not present

## 2021-07-18 HISTORY — DX: Iodine-deficiency related diffuse (endemic) goiter: E01.0

## 2021-07-18 HISTORY — DX: Cardiac murmur, unspecified: R01.1

## 2021-07-18 NOTE — Assessment & Plan Note (Signed)
She was noted to have a murmur on exam postoperatively.  She was mildly anemic at the time and I suspect this is what was going on.  I do not detect a murmur on exam today.  She also has no heart failure symptoms and is euvolemic on exam.  We will repeat her TSH and CBC.  We will defer echocardiogram at this time.

## 2021-07-18 NOTE — Assessment & Plan Note (Signed)
BP improved after her surgery and she is no longer on any antihypertensives.  Today her blood pressure was above the goal of 130/80.  We discussed the fact that it probably can be less than that if she exercises regularly and limits the salt in her diet.  She is going to work on increasing her exercise to at least 150 minutes weekly and keep her sodium under 2500 mg a day.  She will follow-up with her PCP on this and check her blood pressure more closely at home.

## 2021-07-18 NOTE — Assessment & Plan Note (Signed)
Mild thyromegaly noted on exam.  Feels as though she might have a right thyroid nodule.  TSH was normal in March.  Repeat TSH now and get a thyroid ultrasound.

## 2021-07-18 NOTE — Assessment & Plan Note (Signed)
Check a CBC

## 2021-07-18 NOTE — Patient Instructions (Signed)
Medication Instructions:  Continue current medications  *If you need a refill on your cardiac medications before your next appointment, please call your pharmacy*   Lab Work: CBC and TSH  If you have labs (blood work) drawn today and your tests are completely normal, you will receive your results only by: [place] (if you have MyChart) OR A paper copy in the mail If you have any lab test that is abnormal or we need to change your treatment, we will call you to review the results.   Testing/Procedures: Your physician has requested that you have a Thyroid Ultrasound.   Follow-Up: At [facility], you and your health needs are our priority.  As part of our continuing mission to provide you with exceptional heart care, we have created designated Provider Care Teams.  These Care Teams include your primary Cardiologist (physician) and Advanced Practice Providers (APPs -  Physician Assistants and Nurse Practitioners) who all work together to provide you with the care you need, when you need it.  We recommend signing up for the patient portal called "MyChart".  Sign up information is provided on this After Visit Summary.  MyChart is used to connect with patients for Virtual Visits (Telemedicine).  Patients are able to view lab/test results, encounter notes, upcoming appointments, etc.  Non-urgent messages can be sent to your provider as well.   To learn more about what you can do with MyChart, go to [email or web address].    Your next appointment:   As Needed   Other Instructions Your physician discussed the importance of regular exercise and recommended that you start or continue a regular exercise program for good health. 150 minutes a week  Monitor blood pressure goal is less than 130/80

## 2021-07-19 LAB — CBC
Hematocrit: 44 % (ref 34.0–46.6)
Hemoglobin: 14.5 g/dL (ref 11.1–15.9)
MCH: 28.8 pg (ref 26.6–33.0)
MCHC: 33 g/dL (ref 31.5–35.7)
MCV: 87 fL (ref 79–97)
Platelets: 247 10*3/uL (ref 150–450)
RBC: 5.04 x10E6/uL (ref 3.77–5.28)
RDW: 13.1 % (ref 11.7–15.4)
WBC: 8.6 10*3/uL (ref 3.4–10.8)

## 2021-07-19 LAB — TSH: TSH: 1.95 u[IU]/mL (ref 0.450–4.500)

## 2021-07-22 ENCOUNTER — Other Ambulatory Visit: Payer: BC Managed Care – PPO

## 2021-07-25 ENCOUNTER — Telehealth (HOSPITAL_BASED_OUTPATIENT_CLINIC_OR_DEPARTMENT_OTHER): Payer: Self-pay | Admitting: Cardiovascular Disease

## 2021-07-25 NOTE — Telephone Encounter (Signed)
Left message for patient to call and discuss rescheduling  the Thyroid U/S that was cancelled 07/22/21 by [facility]

## 2021-08-01 ENCOUNTER — Encounter (HOSPITAL_BASED_OUTPATIENT_CLINIC_OR_DEPARTMENT_OTHER): Payer: Self-pay

## 2021-08-01 ENCOUNTER — Telehealth (HOSPITAL_BASED_OUTPATIENT_CLINIC_OR_DEPARTMENT_OTHER): Payer: Self-pay | Admitting: Obstetrics & Gynecology

## 2021-08-01 NOTE — Telephone Encounter (Signed)
Called patient and she going to upload her form telling what she needs from [place]. 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

## 2021-08-01 NOTE — Telephone Encounter (Signed)
Patient called and wanted to know will Dr.Miller renew her documentation  for her to go back to grad school and renew her certification .Patient stated that [place] may want to [place] the old one just updated it.

## 2021-08-01 NOTE — Telephone Encounter (Signed)
Left message for patient to call and discuss rescheduling kf

## 2021-08-03 NOTE — Telephone Encounter (Signed)
Left message for patient to call and discuss rescheduling the Thyroid U/S ordered by Dr. Oval Linsey

## 2021-08-05 ENCOUNTER — Encounter (HOSPITAL_BASED_OUTPATIENT_CLINIC_OR_DEPARTMENT_OTHER): Payer: Self-pay | Admitting: Cardiovascular Disease

## 2021-08-05 NOTE — Telephone Encounter (Signed)
Patient has not responded to our requests to call and discuss rescheduling the US Thyroid ordered by Dr. Jobe Marker mail letter requesting patient call.

## 2021-11-09 ENCOUNTER — Telehealth (HOSPITAL_BASED_OUTPATIENT_CLINIC_OR_DEPARTMENT_OTHER): Payer: Self-pay | Admitting: Cardiovascular Disease

## 2021-11-09 NOTE — Telephone Encounter (Signed)
Spoke with patient and discussed rescheduling the Thyroid Ultra sound ordered by Dr. Oval Linsey.  Patient states she does not wish to reschedule because Dr. Oval Linsey told her everything was fine

## 2022-05-12 ENCOUNTER — Ambulatory Visit (HOSPITAL_BASED_OUTPATIENT_CLINIC_OR_DEPARTMENT_OTHER): Payer: BC Managed Care – PPO | Admitting: Obstetrics & Gynecology

## 2023-10-01 ENCOUNTER — Encounter: Payer: Self-pay | Admitting: Physician Assistant
